# Patient Record
Sex: Female | Born: 1985 | Race: White | Hispanic: No | Marital: Married | Smoking: Never smoker
Health system: Southern US, Community
[De-identification: ages and names within clinical notes are randomized; demographics above are authoritative.]

## PROBLEM LIST (undated history)

## (undated) ENCOUNTER — Inpatient Hospital Stay (HOSPITAL_COMMUNITY): Payer: Self-pay

## (undated) DIAGNOSIS — R112 Nausea with vomiting, unspecified: Secondary | ICD-10-CM

## (undated) DIAGNOSIS — F988 Other specified behavioral and emotional disorders with onset usually occurring in childhood and adolescence: Secondary | ICD-10-CM

## (undated) DIAGNOSIS — Z9889 Other specified postprocedural states: Secondary | ICD-10-CM

## (undated) DIAGNOSIS — Z8742 Personal history of other diseases of the female genital tract: Secondary | ICD-10-CM

## (undated) DIAGNOSIS — N946 Dysmenorrhea, unspecified: Secondary | ICD-10-CM

## (undated) HISTORY — PX: GASTROCNEMIUS RECESSION: SUR1071

## (undated) HISTORY — DX: Personal history of other diseases of the female genital tract: Z87.42

## (undated) HISTORY — DX: Dysmenorrhea, unspecified: N94.6

## (undated) HISTORY — PX: WISDOM TOOTH EXTRACTION: SHX21

## (undated) HISTORY — DX: Other specified behavioral and emotional disorders with onset usually occurring in childhood and adolescence: F98.8

---
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
## 2006-07-08 ENCOUNTER — Emergency Department (HOSPITAL_COMMUNITY): Admission: EM | Admit: 2006-07-08 | Discharge: 2006-07-08 | Payer: Self-pay | Admitting: Emergency Medicine

## 2006-07-30 ENCOUNTER — Ambulatory Visit: Payer: Self-pay | Admitting: Family Medicine

## 2006-08-06 ENCOUNTER — Ambulatory Visit: Payer: Self-pay | Admitting: Family Medicine

## 2006-10-21 ENCOUNTER — Ambulatory Visit: Payer: Self-pay | Admitting: Family Medicine

## 2007-04-22 ENCOUNTER — Emergency Department (HOSPITAL_COMMUNITY): Admission: EM | Admit: 2007-04-22 | Discharge: 2007-04-22 | Payer: Self-pay | Admitting: Emergency Medicine

## 2007-08-25 ENCOUNTER — Telehealth (INDEPENDENT_AMBULATORY_CARE_PROVIDER_SITE_OTHER): Payer: Self-pay | Admitting: *Deleted

## 2007-09-07 ENCOUNTER — Ambulatory Visit: Payer: Self-pay | Admitting: Family Medicine

## 2007-10-22 ENCOUNTER — Telehealth (INDEPENDENT_AMBULATORY_CARE_PROVIDER_SITE_OTHER): Payer: Self-pay | Admitting: *Deleted

## 2009-07-03 ENCOUNTER — Encounter (INDEPENDENT_AMBULATORY_CARE_PROVIDER_SITE_OTHER): Payer: Self-pay | Admitting: *Deleted

## 2009-07-03 ENCOUNTER — Ambulatory Visit: Payer: Self-pay | Admitting: Family Medicine

## 2009-07-03 DIAGNOSIS — F988 Other specified behavioral and emotional disorders with onset usually occurring in childhood and adolescence: Secondary | ICD-10-CM | POA: Insufficient documentation

## 2009-07-03 DIAGNOSIS — N946 Dysmenorrhea, unspecified: Secondary | ICD-10-CM | POA: Insufficient documentation

## 2009-07-03 DIAGNOSIS — F411 Generalized anxiety disorder: Secondary | ICD-10-CM | POA: Insufficient documentation

## 2009-08-11 ENCOUNTER — Other Ambulatory Visit: Admission: RE | Admit: 2009-08-11 | Discharge: 2009-08-11 | Payer: Self-pay | Admitting: Family Medicine

## 2009-08-11 ENCOUNTER — Ambulatory Visit: Payer: Self-pay | Admitting: Family Medicine

## 2009-08-11 ENCOUNTER — Encounter: Payer: Self-pay | Admitting: Family Medicine

## 2009-08-15 ENCOUNTER — Encounter (INDEPENDENT_AMBULATORY_CARE_PROVIDER_SITE_OTHER): Payer: Self-pay | Admitting: *Deleted

## 2009-08-15 ENCOUNTER — Telehealth (INDEPENDENT_AMBULATORY_CARE_PROVIDER_SITE_OTHER): Payer: Self-pay | Admitting: *Deleted

## 2009-08-15 LAB — CONVERTED CEMR LAB
ALT: 16 units/L (ref 0–35)
AST: 19 units/L (ref 0–37)
Albumin: 4.2 g/dL (ref 3.5–5.2)
Chloride: 111 meq/L (ref 96–112)
Eosinophils Relative: 3.6 % (ref 0.0–5.0)
GFR calc non Af Amer: 111.13 mL/min (ref >60)
Glucose, Bld: 86 mg/dL (ref 70–99)
HCT: 37.3 % (ref 36.0–46.0)
Hemoglobin: 12.9 g/dL (ref 12.0–15.0)
LDL Cholesterol: 79 mg/dL (ref 0–99)
Lymphs Abs: 1.5 10*3/uL (ref 0.7–4.0)
Monocytes Relative: 11.7 % (ref 3.0–12.0)
Neutro Abs: 2.2 10*3/uL (ref 1.4–7.7)
Potassium: 3.7 meq/L (ref 3.5–5.1)
Sodium: 140 meq/L (ref 135–145)
TSH: 0.61 microintl units/mL (ref 0.35–5.50)
Total Protein: 6.7 g/dL (ref 6.0–8.3)
VLDL: 10.2 mg/dL (ref 0.0–40.0)
WBC: 4.4 10*3/uL — ABNORMAL LOW (ref 4.5–10.5)

## 2009-09-08 ENCOUNTER — Ambulatory Visit: Payer: Self-pay | Admitting: Family Medicine

## 2009-09-08 DIAGNOSIS — M549 Dorsalgia, unspecified: Secondary | ICD-10-CM | POA: Insufficient documentation

## 2010-02-05 ENCOUNTER — Ambulatory Visit: Payer: Self-pay | Admitting: Family

## 2010-02-05 DIAGNOSIS — J309 Allergic rhinitis, unspecified: Secondary | ICD-10-CM | POA: Insufficient documentation

## 2010-02-05 DIAGNOSIS — H1045 Other chronic allergic conjunctivitis: Secondary | ICD-10-CM | POA: Insufficient documentation

## 2010-02-05 DIAGNOSIS — J0100 Acute maxillary sinusitis, unspecified: Secondary | ICD-10-CM

## 2010-04-02 ENCOUNTER — Ambulatory Visit: Payer: Self-pay | Admitting: Family Medicine

## 2010-05-10 ENCOUNTER — Ambulatory Visit: Payer: Self-pay | Admitting: Diagnostic Radiology

## 2010-05-10 ENCOUNTER — Emergency Department (HOSPITAL_BASED_OUTPATIENT_CLINIC_OR_DEPARTMENT_OTHER): Admission: EM | Admit: 2010-05-10 | Discharge: 2010-05-10 | Payer: Self-pay | Admitting: Emergency Medicine

## 2010-05-10 ENCOUNTER — Ambulatory Visit: Payer: Self-pay | Admitting: Family Medicine

## 2010-05-10 DIAGNOSIS — R109 Unspecified abdominal pain: Secondary | ICD-10-CM | POA: Insufficient documentation

## 2010-05-24 ENCOUNTER — Telehealth (INDEPENDENT_AMBULATORY_CARE_PROVIDER_SITE_OTHER): Payer: Self-pay | Admitting: *Deleted

## 2010-07-19 ENCOUNTER — Telehealth (INDEPENDENT_AMBULATORY_CARE_PROVIDER_SITE_OTHER): Payer: Self-pay | Admitting: *Deleted

## 2010-08-02 ENCOUNTER — Encounter (INDEPENDENT_AMBULATORY_CARE_PROVIDER_SITE_OTHER): Payer: Self-pay | Admitting: *Deleted

## 2010-08-28 ENCOUNTER — Telehealth (INDEPENDENT_AMBULATORY_CARE_PROVIDER_SITE_OTHER): Payer: Self-pay | Admitting: *Deleted

## 2010-10-04 ENCOUNTER — Ambulatory Visit: Payer: Self-pay | Admitting: Family Medicine

## 2010-11-07 ENCOUNTER — Encounter (INDEPENDENT_AMBULATORY_CARE_PROVIDER_SITE_OTHER): Payer: Self-pay | Admitting: *Deleted

## 2010-12-11 ENCOUNTER — Telehealth (INDEPENDENT_AMBULATORY_CARE_PROVIDER_SITE_OTHER): Payer: Self-pay | Admitting: *Deleted

## 2011-01-01 NOTE — Progress Notes (Signed)
Summary: bcp questions  Phone Note Call from Patient   Caller: Patient Summary of Call: patient left msg on voicemail will be getting married in 2 months and wants to know that if she can skip her period w/ the micronor.  Follow-up for Phone Call        left message on machine ........Marland KitchenDoristine Devoid  May 24, 2010 9:10 AM   spoke w/ patient informed that after speaking w/ Dr. Beverely Low her current bcp wouldn't allow her to skip her period will need something estrogen based says she would like to try switching is worried she is going to get her period informed patient that switching this late in her cycle may still not guarantee that she isn't going to get her period but would speak w/ Dr. Beverely Low for recommendations lmp-05/17/10 yesterday was last day of period.......Marland KitchenDoristine Devoid  May 24, 2010 3:00 PM   Additional Follow-up for Phone Call Additional follow up Details #1::        can start Alesse on Sunday (low estrogen pil).  will decrease risk of nausea but may have breakthrough bleeding due to low estrogen content. Additional Follow-up by: Katherine Tabori MD,  May 24, 2010 3:14 PM    Additional Follow-up for Phone Call Additional follow up Details #2::    spoke w/ patient informed prescription sent to pharmacy once again infromed no guarantee she will be missing period ......Chemira Jones  May 24, 2010 5:12 PM   New/Updated Medications: * ALESSE 1 tab daily as directed.  disp 3 month supply Prescriptions: ALESSE 1 tab daily as directed.  disp 3 month supply  #3 month x 0   Entered by:   Chemira Jones   Authorized by:   Katherine Tabori MD   Signed by:   Chemira Jones on 05/24/2010   Method used:   Printed then faxed to ...       [facility] #[id number]* (retail)       [address]       [place], [place]  [address]       Ph: [phone]       Fax: [phone]   RxID:   [id number]

## 2011-01-01 NOTE — Assessment & Plan Note (Signed)
Summary: SINUS & EAR PROBLEM X 10 DAYS/KN   Vital Signs:  Patient profile:   25 year old female Height:      62.50 inches Weight:      120 pounds BMI:     21.68 Temp:     98.5 degrees F oral Pulse rate:   84 / minute BP sitting:   112 / 64  (right arm) CC: sinus congestion and cough along w/ ear pain    History of Present Illness: 25 yo woman here today w/ ? sinus infxn.  sxs started 10 days ago w/ 'huge sinus HA'.  has had blurry vision (while sitting in front of the computer), having nasal congestion and drainage, R ear pain>L.  no cough.  no fever.  no tooth pain.  no abd pain.  some dizziness and nausea but no vomiting or diarrhea.  Current Medications (verified): 1)  Wal-Zyr 10 Mg Tabs (Cetirizine Hcl) .... Take 1 Tablet By Mouth Once A Day 2)  Dramamine Less Drowsy 25 Mg Tabs (Meclizine Hcl) .... As Needed 3)  Alesse .Marland Kitchen.. 1 Tab Daily As Directed.  Disp 3 Month Supply  Allergies (verified): 1)  ! Vicodin 2)  ! Erythromycin 3)  ! * Msg  Review of Systems      See HPI  Physical Exam  General:  Well-developed,well-nourished,in no acute distress; alert,appropriate and cooperative throughout examination Head:  NCAT, no TTP over frontal or maxillary sinuses Eyes:  no injxn or inflammation Ears:  mild TM retraction bilaterally, otherwise WNL Nose:  + congestion, turbinate edema Mouth:  Oral mucosa and oropharynx without lesions or exudates.  Teeth in good repair. Neck:  No deformities, masses, or tenderness noted. Lungs:  CTAB Heart:  Normal rate and regular rhythm. S1 and S2 normal without gallop, murmur, click, rub or other extra sounds.   Impression & Recommendations:  Problem # 1:  ALLERGIC RHINITIS (ICD-477.9) Assessment Unchanged likely contributing to pt's sinus congestion and eustachian tube dysfxn.  add sudafed and Nasonex (sample given).  reviewed supportive care and red flags that should prompt return.  Pt expresses understanding and is in agreement w/ this  plan. The following medications were removed from the medication list:    Promethazine Hcl 25 Mg Tabs (Promethazine hcl) .Marland Kitchen... 1 tab by mouth q6 as needed for nausea Her updated medication list for this problem includes:    Wal-zyr 10 Mg Tabs (Cetirizine hcl) .Marland Kitchen... Take 1 tablet by mouth once a day  Complete Medication List: 1)  Wal-zyr 10 Mg Tabs (Cetirizine hcl) .... Take 1 tablet by mouth once a day 2)  Dramamine Less Drowsy 25 Mg Tabs (Meclizine hcl) .... As needed 3)  Alesse  .Marland Kitchen.. 1 tab daily as directed.  disp 3 month supply  Patient Instructions: 1)  Your nasal congestion is causing your ear pain and sinus pressure 2)  Take Sudafed as directed on the box for the next 3-5 days 3)  Use the Nasonex- 2 sprays each nostril daily- at least until you're feeling better 4)  Tylenol or ibuprofen as needed for pain or fever 5)  Drink plenty of fluids 6)  Mucinex to help thin your congestion 7)  Hang in there!   Orders Added: 1)  Est. Patient Level III [[address]]

## 2011-01-01 NOTE — Progress Notes (Signed)
Summary: NEEDS LUTERA (ALLESSE) 90 DAY SUPPLY TO [facility]  Phone Note Call from Patient Call back at Home Phone [phone]   Caller: Patient Summary of Call: PATIENT NOW HAS NEW NAME AND ADDRESS-BOTH HAVE BEEN UPDATED INN IDX AND EMR  -WAS TOLD BY INSURANCE THAT SHE CANT PICKUP LUTERA (ALESSE)  AT [facility] ANYMORE--NOW NEEDS TO ORDER 90 DAY PRESCRIPTION THRU [facility]---PLEASE CALL IN NEW PRESCRIPTION TO [facility] Initial call taken by: Jerolyn Shin,  August 28, 2010 3:45 PM    Prescriptions: ALESSE 1 tab daily as directed.  disp 3 month supply  #3 month x 0   Entered by:   Jeremy Johann CMA   Authorized by:   Neena Rhymes MD   Signed by:   Jeremy Johann CMA on 08/28/2010   Method used:   Faxed to ...       [facility])       [address] [address]       [place], [place]  [address]       Ph: [phone]       Fax: [phone]   RxID:   [id number]

## 2011-01-01 NOTE — Assessment & Plan Note (Signed)
Summary: reaction to birth control pill//lch   Vital Signs:  Patient profile:   25 year old female Weight:      114 pounds Temp:     98.4 degrees F oral BP sitting:   110 / 80  (left arm)  Vitals Entered By: Doristine Devoid (Apr 02, 2010 11:35 AM) CC: nausea, vomiting and some stomach cramping after taking bcp last night   History of Present Illness: 25 yo woman here today for N/V and abd cramping after taking OCP last night.  pt never started pills in August.  took pill at 8pm and woke at 3am vomiting 'yellow bile'.  then developed diarrhea.  took tums- vomited.  took immodium at 6am with improvement in sxs.  reports 'the worst cramps i've ever felt'- relieved w/ heating pad.  reports having cramps every 3-5 minutes initially but this has spaced out.  no known sick contacts.  did not take OCP w/ food.  LMP 4/20.  denies fever.  reports mom also had severe problems w/ N/V when on OCPs.  Medications Prior to Update: 1)  Sprintec 28 0.25-35 Mg-Mcg Tabs (Norgestimate-Eth Estradiol) .... Take 1 Tab Daily As Directed For Painful Periods 2)  Wal-Zyr 10 Mg Tabs (Cetirizine Hcl) .... Take 1 Tablet By Mouth Once A Day 3)  Dramamine Less Drowsy 25 Mg Tabs (Meclizine Hcl) .... As Needed 4)  Patanol 0.1 % Soln (Olopatadine Hcl) .... One Drop Each Eye Two Times A Day  Allergies (verified): 1)  ! Vicodin 2)  ! Erythromycin 3)  ! * Msg  Past History:  Social History: Last updated: 07/03/2009 in school for medical office administration lives w/ mom and dad is a CNA- works in a surgical center ** has phobia of needles**  Review of Systems      See HPI  Physical Exam  General:  pt not feeling well Mouth:  MMM Lungs:  Normal respiratory effort, chest expands symmetrically. Lungs are clear to auscultation, no crackles or wheezes. Heart:  Normal rate and regular rhythm. S1 and S2 normal without gallop, murmur, click, rub or other extra sounds. Abdomen:  soft, NT/ND, +BS- normoactive Psych:   very anxious   Impression & Recommendations:  Problem # 1:  VOMITING (ICD-787.03) Assessment New difficult to determine whether this is due to viral illness or start of OCPs.  pt to stop OCPs.  reviewed appropriate start date- pt started this pack mid cycle during peak estrogen levels.  pt reports feeling '90% better' at time of visit.  no signs of dehydration.  script for phenergan given to use as needed.  will switch pt's OCP to Micronor given reported familial intolerance to estrogen.  stressed the importance of taking w/ food.  will follow.  Complete Medication List: 1)  Wal-zyr 10 Mg Tabs (Cetirizine hcl) .... Take 1 tablet by mouth once a day 2)  Dramamine Less Drowsy 25 Mg Tabs (Meclizine hcl) .... As needed 3)  Patanol 0.1 % Soln (Olopatadine hcl) .... One drop each eye two times a day 4)  Promethazine Hcl 25 Mg Tabs (Promethazine hcl) .Marland Kitchen.. 1 tab by mouth q6 as needed for nausea 5)  Ortho Micronor 0.35 Mg Tabs (Norethindrone) .Marland Kitchen.. 1 tab daily as directed.  disp 1 pack  Patient Instructions: 1)  Follow up as needed 2)  STOP the pill pack 3)  Plan on restarting the Sunday of your next period.  If you bleed on a Sunday, start that day.  If you bleed Mon-Sat, start  your pill that Sunday. 4)  Always take your birth control with food to minimize nausea 5)  Use the phenergan and immodium as needed 6)  Drink plenty of fluids 7)  Hang in there!! Prescriptions: ORTHO MICRONOR 0.35 MG TABS (NORETHINDRONE) 1 tab daily as directed.  disp 1 pack  #1 x 11   Entered and Authorized by:   Katherine Tabori MD   Signed by:   Katherine Tabori MD on 04/02/2010   Method used:   Electronically to        [facility] #[id number]* (retail)       [address]       [place]       [place], [place]  [address]       Ph: [phone]       Fax: [phone]   RxID:   [id number] PROMETHAZINE HCL 25 MG  TABS (PROMETHAZINE HCL) 1 tab by mouth Q6 as needed for nausea  #20 x 0    Entered and Authorized by:   Katherine Tabori MD   Signed by:   Katherine Tabori MD on 04/02/2010   Method used:   Electronically to        [facility] #[id number]* (retail)       [address]       [place], [place]  [address]       Ph: [phone]       Fax: [phone]   RxID:   [id number]

## 2011-01-01 NOTE — Assessment & Plan Note (Signed)
Summary: sinus pressure and pain//lch   Vital Signs:  Patient profile:   25 year old female Weight:      114.75 pounds BMI:     20.73 Temp:     98.2 degrees F oral Pulse rate:   68 / minute Pulse rhythm:   regular Resp:     16 per minute BP sitting:   104 / 64  (right arm) Cuff size:   regular  Vitals Entered By: Mervin Kung CMA (February 05, 2010 3:23 PM) CC: room 4  Sinus pressure and pain x 1 week.  Dizziness episode yesterday. Comments needs refill on sprintec   CC:  room 4  Sinus pressure and pain x 1 week.  Dizziness episode yesterday.Marland Kitchen  History of Present Illness: Ms Katie Lindsey is a 25 year old female who presents withc/o sinus pressure x 5 days.  She coughs up yellow mucous.  Has been taking walzyr.  Denies fever.  Yesterday had some dizziness.  Eyes have been itching, especially right  Allergies: 1)  ! Vicodin 2)  ! Erythromycin 3)  ! * Msg  Physical Exam  General:  Well-developed,well-nourished,in no acute distress; alert,appropriate and cooperative throughout examination Head:  Normocephalic and atraumatic without obvious abnormalities. No apparent alopecia or balding. Some mild tenderness to the frontal sinus to palpation Eyes:  PERRLA,  some mild injection R sclear Ears:  Serous effusion left ear without bulging or erythema Mouth:  Oral mucosa and oropharynx without lesions or exudates.  Teeth in good repair. Neck:  No deformities, masses, or tenderness noted. Lungs:  Normal respiratory effort, chest expands symmetrically. Lungs are clear to auscultation, no crackles or wheezes. Heart:  Normal rate and regular rhythm. S1 and S2 normal without gallop, murmur, click, rub or other extra sounds.   Impression & Recommendations:  Problem # 1:  SINUSITIS (ICD-473.9) Assessment New Dizziness is likely related to sinusitus as well as serous effusion in ear.  Will plan to treat with amoxicilin x 10 days. Her updated medication list for this problem includes:  Amoxicillin 500 Mg Cap (Amoxicillin) .Marland Kitchen... Take 1 capsule by mouth three times a day x 10 days  Problem # 2:  CONJUNCTIVITIS, ALLERGIC (ICD-372.14) Assessment: New Will treat with patanol, pt instructed to call if redness worsens or does not improve.   Complete Medication List: 1)  Sprintec 28 0.25-35 Mg-mcg Tabs (Norgestimate-eth estradiol) .... Take 1 tab daily as directed for painful periods 2)  Wal-zyr 10 Mg Tabs (Cetirizine hcl) .... Take 1 tablet by mouth once a day 3)  Dramamine Less Drowsy 25 Mg Tabs (Meclizine hcl) .... As needed 4)  Amoxicillin 500 Mg Cap (Amoxicillin) .... Take 1 capsule by mouth three times a day x 10 days 5)  Patanol 0.1 % Soln (Olopatadine hcl) .... One drop each eye two times a day  Patient Instructions: 1)  Call if you develop fever over 101, increasing sinus pressure, pain with eye movement, increased facial tenderness of swelling, or if you develop visual changes or if the redness dose not improve with drops Prescriptions: PATANOL 0.1 % SOLN (OLOPATADINE HCL) one drop each eye two times a day  #1 x 0   Entered and Authorized by:   Lemont Fillers FNP   Signed by:   Lemont Fillers FNP on 02/05/2010   Method used:   Electronically to        [facility] #[id number]* (retail)       [address]  [place], [place]  [address]       Ph: [phone]       Fax: [phone]   RxID:   [id number] AMOXICILLIN 500 MG CAP (AMOXICILLIN) Take 1 capsule by mouth three times a day X 10 days  #30 x 0   Entered and Authorized by:   Lemont Fillers FNP   Signed by:   Lemont Fillers FNP on 02/05/2010   Method used:   Electronically to        [facility] #[id number]* (retail)       [address]       [place], [place]  [address]       Ph: [phone]       Fax: [phone]   RxID:   [id number]   Current Allergies (reviewed today): ! VICODIN !  ERYTHROMYCIN ! * MSG

## 2011-01-01 NOTE — Assessment & Plan Note (Signed)
Summary: possible ovarian cyst/walk-in//kn   Vital Signs:  Patient profile:   25 year old female Weight:      112 pounds Temp:     97.7 degrees F oral BP sitting:   112 / 72  (left arm)  Vitals Entered By: Doristine Devoid (May 10, 2010 8:51 AM) CC: Pain started on L side x3-4days ago now this morning pain on R side cramping feeling and sharp pain    History of Present Illness: 25 yo woman here today for abd pain.  sxs started 3-4 days ago on L side, described as sharp, worse w/ movement.  this AM developed severe abd pain, doubled over, 'couldn't move my legs b/c it hurt', pain is radiating to R side and rectum.  now pain on R side.  no nausea/vomiting.  pt feels pain is an ovarian cyst- mom has hx of similar and family feels sxs are consistent.  hasn't taken anything for pain.  mom reports pt was very sensitive to bumps in the car on the ride over.  Problems Prior to Update: 1)  Conjunctivitis, Allergic  (ICD-372.14) 2)  Allergic Rhinitis  (ICD-477.9) 3)  Sinusitis  (ICD-473.9) 4)  Back Pain  (ICD-724.5) 5)  Routine Gynecological Examination  (ICD-V72.31) 6)  Screening For Malignant Neoplasm of The Cervix  (ICD-V76.2) 7)  Anxiety  (ICD-300.00) 8)  Dysmenorrhea  (ICD-625.3) 9)  Add  (ICD-314.00)  Current Medications (verified): 1)  Wal-Zyr 10 Mg Tabs (Cetirizine Hcl) .... Take 1 Tablet By Mouth Once A Day 2)  Dramamine Less Drowsy 25 Mg Tabs (Meclizine Hcl) .... As Needed 3)  Patanol 0.1 % Soln (Olopatadine Hcl) .... One Drop Each Eye Two Times A Day 4)  Promethazine Hcl 25 Mg  Tabs (Promethazine Hcl) .Marland Kitchen.. 1 Tab By Mouth Q6 As Needed For Nausea 5)  Ortho Micronor 0.35 Mg Tabs (Norethindrone) .Marland Kitchen.. 1 Tab Daily As Directed.  Disp 1 Pack  Allergies (verified): 1)  ! Vicodin 2)  ! Erythromycin 3)  ! * Msg  Past History:  Past Medical History: Last updated: 07/03/2009 ADD dysmenorrhia  Social History: Last updated: 07/03/2009 in school for medical office  administration lives w/ mom and dad is a CNA- works in a surgical center ** has phobia of needles**  Review of Systems      See HPI  Physical Exam  General:  obviously uncomfortable, walking bent at a 90 degree angle Lungs:  CTAB Heart:  tachy but regular Abdomen:  soft, point TTP over McBurney's point, pain on R side w/ palpation of the L, voluntary guarding.  + BS Psych:  tearful, very anxiou   Impression & Recommendations:  Problem # 1:  ABDOMINAL PAIN, ACUTE (ICD-789.00) Assessment New given pt's acutely different pain this AM in RLQ need to send to ER for CT and evaluation for possible appendicitis.  possible ovarian cyst but given abd pain pelvic US likely insufficient.  level of pain severe enough to warrant immediate evaluation and not wait for outpt CT.  discussed plan w/ pt and mother.  they express understanding and are in agreement.  spoke w/ triage nurse at [facility]- they are aware of pt.  Complete Medication List: 1)  Wal-zyr 10 Mg Tabs (Cetirizine hcl) .... Take 1 tablet by mouth once a day 2)  Dramamine Less Drowsy 25 Mg Tabs (Meclizine hcl) .... As needed 3)  Patanol 0.1 % Soln (Olopatadine hcl) .... One drop each eye two times a day 4)  Promethazine Hcl 25 Mg  Tabs (Promethazine hcl) .Marland Kitchen.. 1 tab by mouth q6 as needed for nausea 5)  Ortho Micronor 0.35 Mg Tabs (Norethindrone) .Marland Kitchen.. 1 tab daily as directed.  disp 1 pack

## 2011-01-01 NOTE — Progress Notes (Signed)
Summary: bcp refill   Phone Note Refill Request Message from:  Fax from Pharmacy on July 19, 2010 2:12 PM  Refills Requested: Medication #1:  ALESSE 1 tab daily as directed.  disp 3 month supply. [facility] - [id number]  Initial call taken by: Okey Regal Spring,  July 19, 2010 2:14 PM    Prescriptions: ALESSE 1 tab daily as directed.  disp 3 month supply  #3 month x 0   Entered by:   Doristine Devoid CMA   Authorized by:   Neena Rhymes MD   Signed by:   Doristine Devoid CMA on 07/20/2010   Method used:   Printed then faxed to ...       [facility] #[id number]* (retail)       [address]       [place], [place]  [address]       Ph: [phone]       Fax: [phone]   RxID:   [id number]

## 2011-01-03 NOTE — Progress Notes (Signed)
Summary: Ins co needs results from June 2011 CAT scan  Phone Note Other Incoming   Caller: Cherie from [employer] Summary of Call: Cherie from [employer] called to see if we had the cat scan results that took place after the 05/10/10 office visit----see copy of Med Rec Request in our system dated 11/21/2010  Cherie has received all the other med records, but no copy of the Cat scan results---can we get a copy of this report?  Cherie says we can call her with the results of the cat scan--no need to fax it to her  She works Mon - Fri  8:00 to 4:15-----her direct number is [phone]  Initial call taken by: Jerolyn Shin,  December 11, 2010 2:34 PM  Follow-up for Phone Call        Spoke with cherie advise her that Pt was seen in ED and we did not receive copy of CT result. Check  E-chart to obtain record and faxed copy of CT report to cherie @ [phone].............Marland KitchenFelecia Deloach CMA  December 11, 2010 3:51 PM

## 2011-02-18 LAB — COMPREHENSIVE METABOLIC PANEL
ALT: 11 U/L (ref 0–35)
Alkaline Phosphatase: 57 U/L (ref 39–117)
BUN: 9 mg/dL (ref 6–23)
CO2: 21 mEq/L (ref 19–32)
GFR calc non Af Amer: >60 mL/min (ref >60)
Glucose, Bld: 91 mg/dL (ref 70–99)
Potassium: 4.2 mEq/L (ref 3.5–5.1)
Sodium: 141 mEq/L (ref 135–145)
Total Protein: 7.6 g/dL (ref 6.0–8.3)

## 2011-02-18 LAB — URINALYSIS, ROUTINE W REFLEX MICROSCOPIC
Glucose, UA: NEGATIVE mg/dL
Ketones, ur: 15 mg/dL — AB
Nitrite: NEGATIVE
Protein, ur: NEGATIVE mg/dL
Urobilinogen, UA: 0.2 mg/dL (ref 0.0–1.0)

## 2011-02-18 LAB — DIFFERENTIAL
Basophils Absolute: 0 10*3/uL (ref 0.0–0.1)
Basophils Relative: 0 % (ref 0–1)
Eosinophils Absolute: 0 10*3/uL (ref 0.0–0.7)
Monocytes Relative: 8 % (ref 3–12)
Neutro Abs: 7 10*3/uL (ref 1.7–7.7)
Neutrophils Relative %: 79 % — ABNORMAL HIGH (ref 43–77)

## 2011-02-18 LAB — CBC
MCHC: 34.4 g/dL (ref 30.0–36.0)
RDW: 11 % — ABNORMAL LOW (ref 11.5–15.5)

## 2011-02-18 LAB — PREGNANCY, URINE: Preg Test, Ur: NEGATIVE

## 2011-04-17 ENCOUNTER — Encounter: Payer: Self-pay | Admitting: Family Medicine

## 2011-05-25 IMAGING — CT CT ABD-PELV W/ CM
2 of 3 series · 16 of 46 positions shown, 18 images · IV contrast (APPLIED)
Comparison: None

CLINICAL DATA: Right lower quadrant pain.

CT ABDOMEN AND PELVIS WITH CONTRAST
TECHNIQUE: Multidetector CT imaging of the abdomen and pelvis was
performed following the standard protocol during bolus
administration of intravenous contrast.
Contrast: 100 ml Omnipaque 300 IV.

[Series 2: abd/pelvis 5.0 b31f · axial · 0.62mm/px · z∈[-712,-378]mm · 13 of 79 slices shown, 15 images]
[im 6/79  soft-tissue]
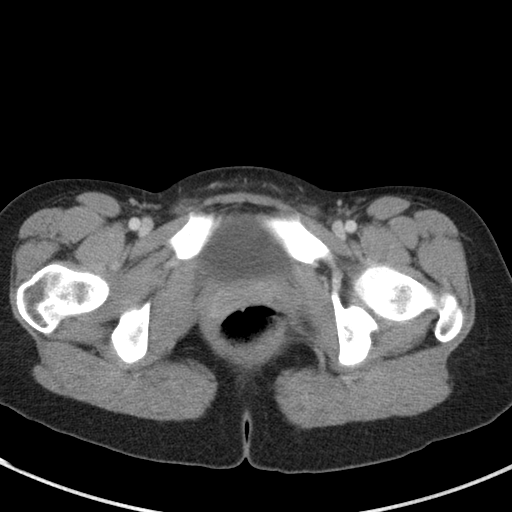
[im 6/79  bone]
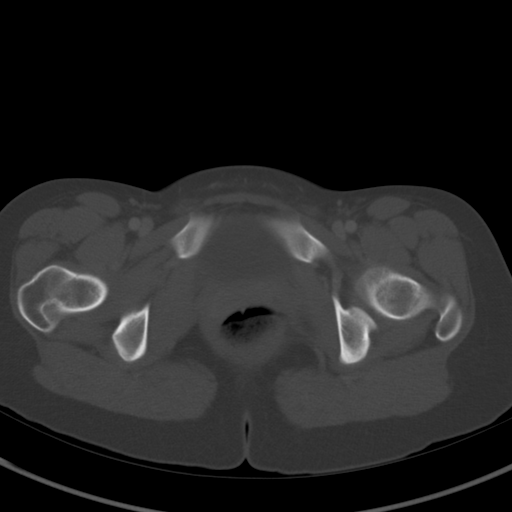
[im 11/79  soft-tissue]
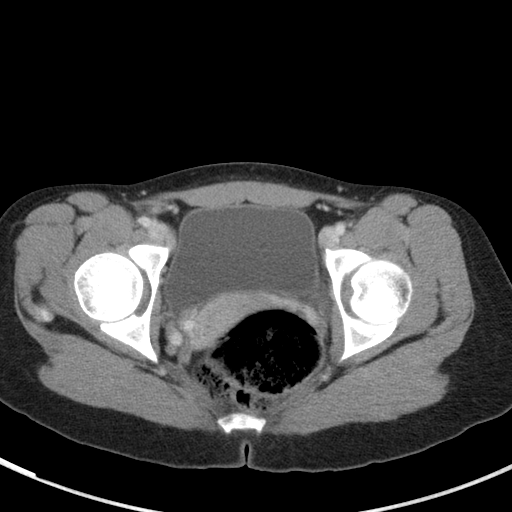
[im 16/79  soft-tissue]
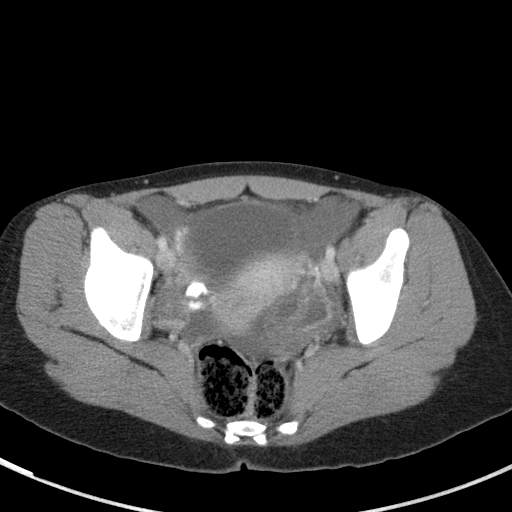
[im 23/79  soft-tissue]
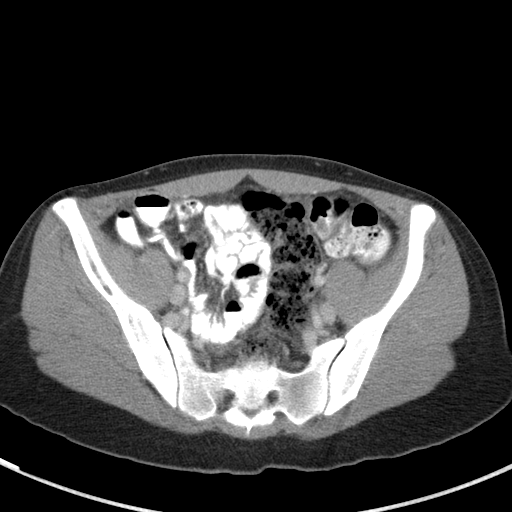
[im 28/79  soft-tissue]
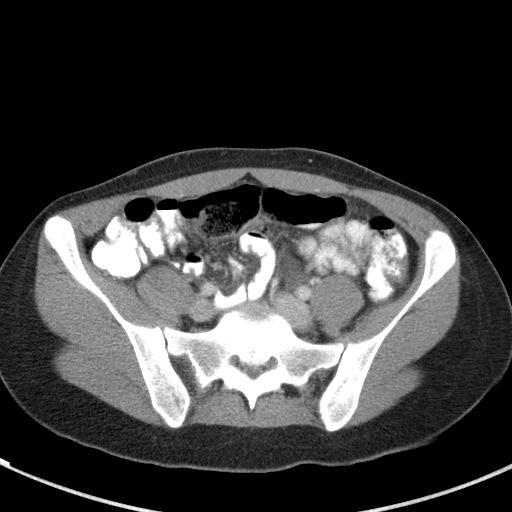
[im 33/79  soft-tissue]
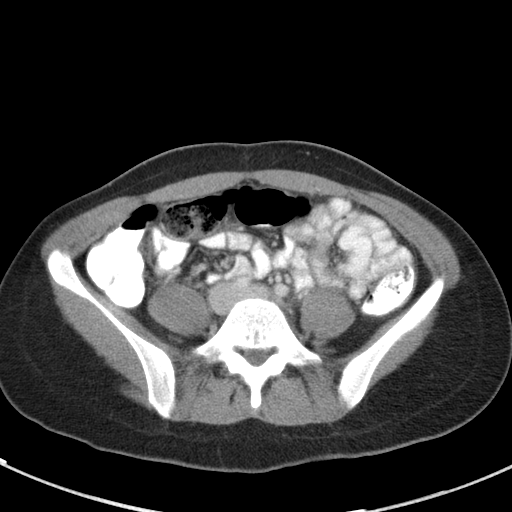
[im 41/79  soft-tissue]
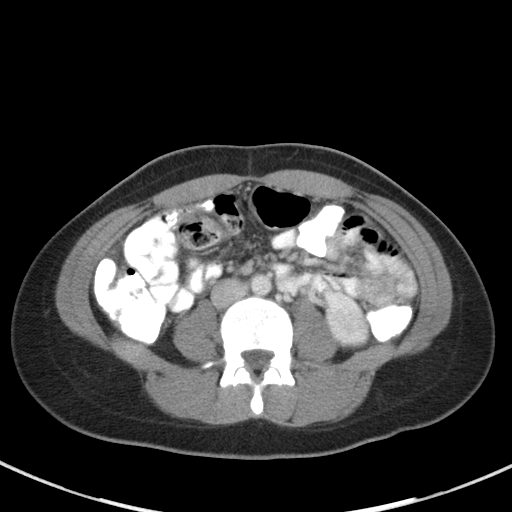
[im 46/79  soft-tissue]
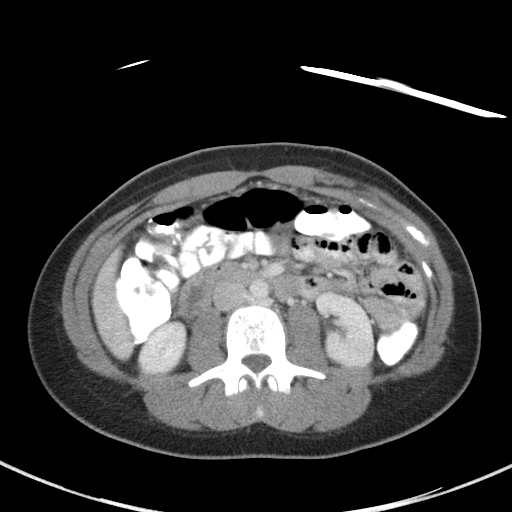
[im 51/79  soft-tissue]
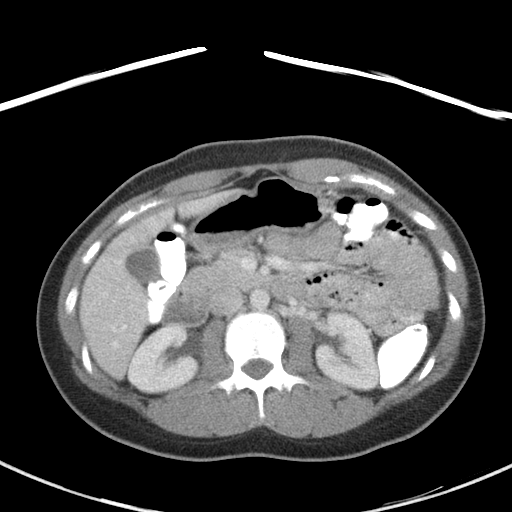
[im 51/79  bone]
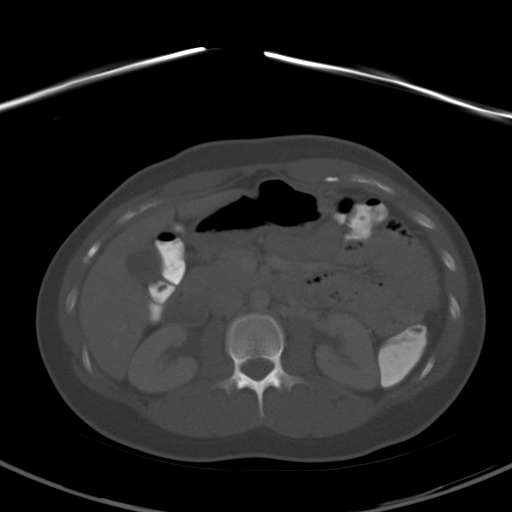
[im 56/79  soft-tissue]
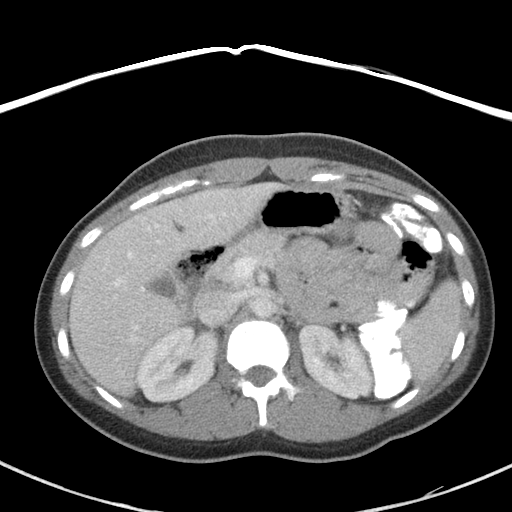
[im 63/79  soft-tissue]
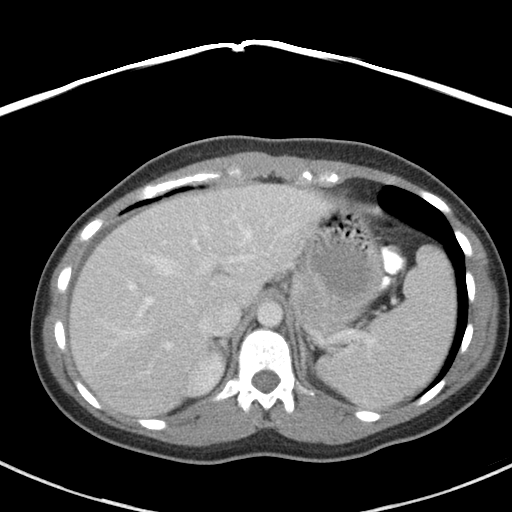
[im 68/79  soft-tissue]
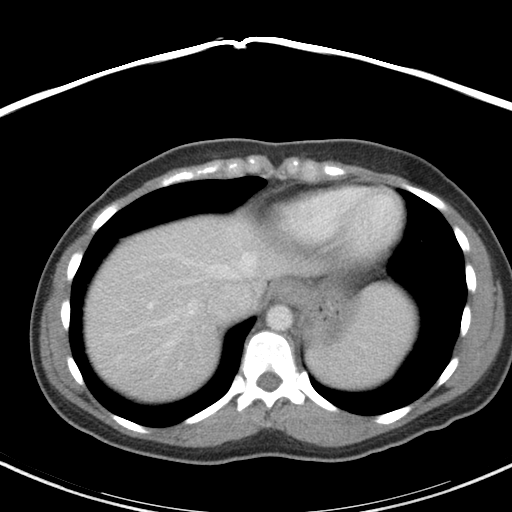
[im 73/79  soft-tissue]
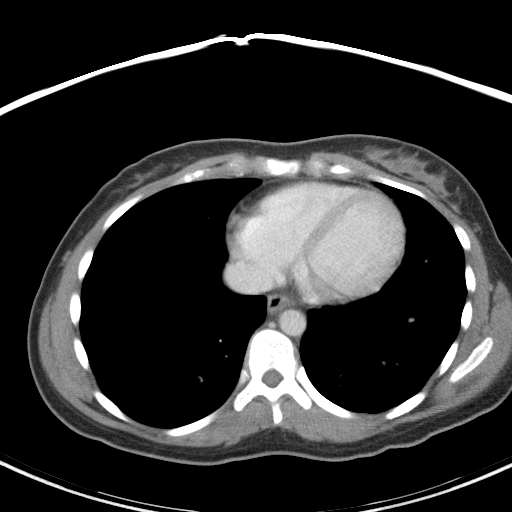

[Series 5: abd/pelvis 3.0 coronal · coronal · 0.60mm/px · 3 of 71 slices shown]
[im 24/71  soft-tissue]
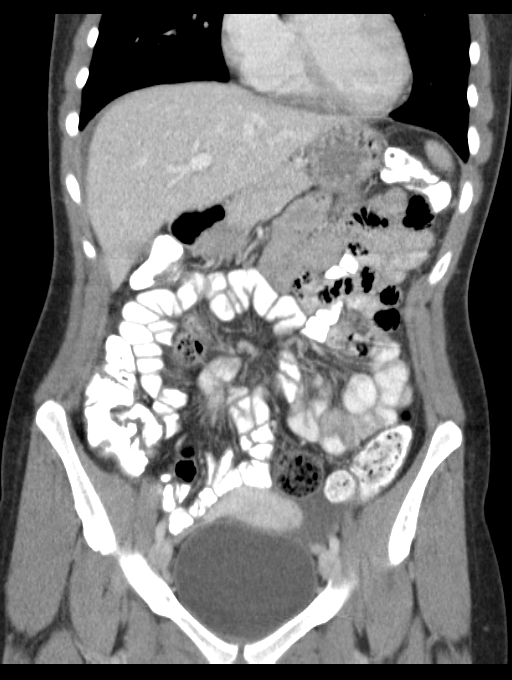
[im 32/71  soft-tissue]
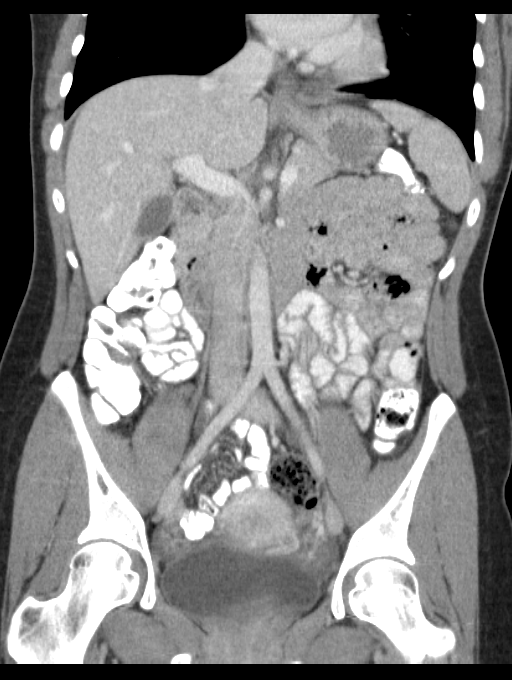
[im 39/71  soft-tissue]
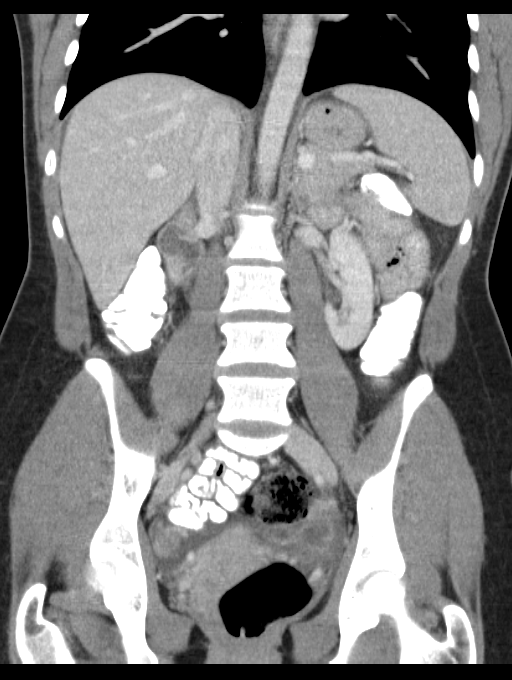

[16 of 46 positions shown; findings below may reference images not displayed]

FINDINGS: Lung bases are clear.  No effusions.  Heart is normal
size.

There is a small amount of free fluid adjacent to the liver, with
moderate free fluid in the pelvis.  Uterus is unremarkable.
Irregular enhancing cystic lesion is noted in the left adnexa.  It
is unclear this represents a ruptured ovarian cyst or could
represent pelvic inflammatory disease.  Recommend clinical
correlation.  No definite right adnexal abnormality.

Appendix is visualized and is normal.  Large amount stool
throughout the rectosigmoid colon.  Small bowel is unremarkable.

Liver, spleen, gallbladder, pancreas, adrenals and kidneys are
unremarkable.  Aorta is normal caliber.

No acute bony abnormality.
IMPRESSION: Free fluid in the abdomen and pelvis, most pronounced in the
pelvis.  Irregular cystic area in the left adnexa with enhancing
walls could reflect a ruptured cyst or pelvic inflammatory disease.
Recommend clinical correlation.

Moderate stool burden in the rectosigmoid colon.

Normal appendix.

## 2011-07-29 ENCOUNTER — Telehealth: Payer: Self-pay | Admitting: Family Medicine

## 2011-08-07 ENCOUNTER — Encounter: Payer: Self-pay | Admitting: Family Medicine

## 2011-08-07 ENCOUNTER — Ambulatory Visit (INDEPENDENT_AMBULATORY_CARE_PROVIDER_SITE_OTHER): Payer: 59 | Admitting: Family Medicine

## 2011-08-07 VITALS — BP 110/80 | Temp 99.3°F | Wt 123.0 lb

## 2011-08-07 DIAGNOSIS — D229 Melanocytic nevi, unspecified: Secondary | ICD-10-CM

## 2011-08-07 DIAGNOSIS — D239 Other benign neoplasm of skin, unspecified: Secondary | ICD-10-CM

## 2011-08-07 NOTE — Patient Instructions (Signed)
We will call you with your derm referral Right now, there is nothing that has me overly concerned Call with any questions or concerns Hang in there!!!!

## 2011-08-07 NOTE — Progress Notes (Signed)
  Subjective:    Patient ID: Katie Lindsey, female    DOB: 11/21/1986, 25 y.o.   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  Fears this is skin cancer.  Has severe phobia of needles and is very anxious about mole removal.  Review of Systems For ROS see HPI     Objective:   Physical Exam  Vitals reviewed. Constitutional: She appears well-developed and well-nourished.  Skin: Skin is warm and dry.       1.5 cm oblong nevus central back w/out concerning borders or pigmentation. Has multiple other nevi- small but dark, irregularly pigmented mole just inferior to one pt is concerned about is more concerning.          Assessment & Plan:

## 2011-08-07 NOTE — Assessment & Plan Note (Signed)
Due to multiple nevi and pt's severe phobia of needles and desire for optimal cosmetic outcome, will refer to derm.  Pt expressed understanding and is in agreement w/ plan.

## 2011-08-08 NOTE — Telephone Encounter (Signed)
error 

## 2012-08-17 ENCOUNTER — Telehealth: Payer: Self-pay | Admitting: Family Medicine

## 2012-08-17 NOTE — Telephone Encounter (Signed)
needs to know if we have records showing pt got Chicken pox vaccine from peds dr in our records, if so she needs documentation Cb# [phone] Put chart on chair for review

## 2012-08-18 NOTE — Telephone Encounter (Signed)
Noted in pt chart that the only immunization noted is the TD, no chicken pox vaccine noted, pt noted that her mom said she had it but the titers cam back that she is NOT immune, the pediatric office is no longer open and unable to contact,advised she can try to contact her school for any immunization records they may have on file, pt understood and will try to contact school

## 2012-08-25 ENCOUNTER — Ambulatory Visit (INDEPENDENT_AMBULATORY_CARE_PROVIDER_SITE_OTHER): Payer: 59 | Admitting: *Deleted

## 2012-08-25 ENCOUNTER — Telehealth: Payer: Self-pay | Admitting: *Deleted

## 2012-08-25 DIAGNOSIS — Z23 Encounter for immunization: Secondary | ICD-10-CM

## 2012-08-25 DIAGNOSIS — Z299 Encounter for prophylactic measures, unspecified: Secondary | ICD-10-CM

## 2012-08-25 NOTE — Telephone Encounter (Signed)
Pt came into office for varicella vaccine per titers noted and vaccine scheduled, after vaccine given pt asked when she needed to come get the second step per was advised by her GYN that the varicella shot needs to be given in steps now, pt notes she is receiving the vaccine per trying to get pregnant, advised for pt to contact GYN for verification but also wanted to see if MD Beverely Low has ever heard about a second step vaccine for chicken pox/varicella, please advise

## 2012-08-25 NOTE — Patient Instructions (Signed)
Pt given instructions/format for vaccine given. Pt understood

## 2012-08-26 NOTE — Telephone Encounter (Signed)
.  left message to have patient return my call.  

## 2012-08-26 NOTE — Telephone Encounter (Signed)
2nd dose is required- needs to be given at least 4 weeks after 1st

## 2012-08-26 NOTE — Telephone Encounter (Signed)
Spoke to pt to advise results/instructions. Pt understood. Pt will schedule second dosage for vaccine at earliest convience

## 2012-09-22 ENCOUNTER — Ambulatory Visit (INDEPENDENT_AMBULATORY_CARE_PROVIDER_SITE_OTHER): Payer: 59

## 2012-09-22 DIAGNOSIS — Z23 Encounter for immunization: Secondary | ICD-10-CM

## 2012-12-02 NOTE — L&D Delivery Note (Signed)
Delivery Note At 2:30 PM a viable female was delivered via Vaginal, Spontaneous Delivery (Presentation: ; Occiput Anterior).  APGAR: 9, 9; weight pending Placenta status: Intact, Spontaneous.  Cord: 3 vessels with the following complications: .  Cord pH: not done  Anesthesia: Epidural  Episiotomy: None Lacerations: 2nd degree Suture Repair: 2.0 3.0 chromic Est. Blood Loss (mL): 300  Mom to postpartum.  Baby to nursery-stable.  Oakleigh Hesketh L 08/04/2013, 2:45 PM

## 2012-12-04 ENCOUNTER — Telehealth: Payer: Self-pay | Admitting: Family Medicine

## 2012-12-04 NOTE — Telephone Encounter (Signed)
Agree w/ advice given 

## 2012-12-04 NOTE — Telephone Encounter (Signed)
Pt called to get appt for this afternoon. Pt has dull abdominal pain and is [redacted] weeks pregnant. Patient was hoping to get an ultrasound. I advised pt we have no available appointments today and no ultrasound machine. Spoke w/Tabori who advised that pt should call gyn and ask for MD on call or go to [facility] MAU. I called pt back and informed pt. Patient understood.

## 2012-12-23 ENCOUNTER — Telehealth: Payer: Self-pay | Admitting: Family Medicine

## 2012-12-23 NOTE — Telephone Encounter (Signed)
Tamiflu is category C in pregnancy and unless recommended by OB will not be given.  Should take Zpack as prescribed by OB, drink plenty of fluids to avoid dehydration, tylenol as needed for fever, and rest.  No need for OV at this time.

## 2012-12-23 NOTE — Telephone Encounter (Signed)
[facility] Triage Call Report Triage Record Num: [id number] Operator: Remonia Richter Patient Name: Katie Lindsey Call Date & Time: 12/22/2012 9:34:57PM Patient Phone: [phone] PCP: Neena Rhymes ([facility]) Patient Gender: Female PCP Fax : Patient DOB: 1986-03-02 Practice Name: [facility] Reason for Call: Caller: Beverly/Mother; PCP: Sheliah Hatch.; CB#: [phone];Mom is calling regarding Cough/Congestion; seen in OB office today and given Z-Pac for sinus infection,now her husband was tested positive for flu, she has fever at 100.6 ax tonight along with cough, mom wants to know if she needs seen here for Tamiflu, she gave triage information based on seeing daughter earlier tonight, Call Provider disposition obtained due to being High risk for flu, she has already talked to [facility] who wants her to call us , mom advised she call in am for appointment for eval due to being High risk,no standing order for Tamiflu, Home Care advice given, mom to have her call in am for appt,callback perimeters gone over Protocol(s) Used: Flu-Like Symptoms Recommended Outcome per Protocol: Call Provider within 8 Hours Reason for Outcome: Any temperature elevation in an individual with a weakened immune system OR a frail elderly person Care Advice: ~ Rest until symptoms improve. Aspirin should not be used in anyone, 26 years of age and under, for temperature control, pain, or aches when flu is a possibility. ~ ~ SYMPTOM / CONDITION MANAGEMENT COMFORT MEASURES FOR A FEVER: - Drink cool liquids or eat ice chips or popsicles. Avoid drinks with alcohol or caffeine. - Wear one layer of light-weight clothing. - Consider using a fan to improve circulation. - Rest until temperature returns to normal and other symptoms improve. - Use a lightweight blanket or other bedding. - A lukewarm (not cold) bath or shower can help lower body temperature. Cold water can cause shivering and  raise temperature. If shivering starts, dry off and cover with lightweight clothing. ~ Analgesic/Antipyretic Advice - Acetaminophen: Consider acetaminophen as directed on label or by pharmacist/provider for pain or fever. PRECAUTIONS: - Use only if there is no history of liver disease, alcoholism, or intake of three or more alcohol drinks per day. - If approved by provider when breastfeeding. - Do not exceed recommended dose or frequency. ~ Analgesic/Antipyretic Advice - NSAIDs: Consider aspirin, ibuprofen, naproxen or ketoprofen for pain or fever as directed on label or by pharmacist/provider. PRECAUTIONS: - If over 80 years of age, should not take longer than 1 week without consulting provider. EXCEPTIONS: - Should not be used if taking blood thinners or have bleeding problems. - Do not use if have history of sensitivity/allergy to any of these medications; or history of cardiovascular, ulcer, kidney, liver disease or diabetes unless approved by provider. - Do not exceed recommended dose or frequency. ~ 12/22/2012 9:51:54PM Page 1 of 2 [id number] [facility] Triage Call Report Patient Name: Zaiya Annunziato continuation page/s - Do not exceed recommended dose or frequency. ~ If alone, have someone check in with you several times a day while you are feeling ill. Antiviral medications - Prescribed medications are available in pills, liquids or inhaled powder that help prevent or lessen symptoms of viral illnesses. Antivirals are usually given to persons at a higher risk for flu-related complications or who are very ill. Most healthy people do not need to be treated with antiviral drugs. - Recommended medications for this season: oseltamivir (Tamiflu) and zanamivir (Relenza). - Work best when started within the first two days of symptoms. - Speak with your provider as soon as possible if exposed to  the flu or if you have new symptoms of the flu. ~ Total water intake includes  drinking water, water in beverages, and water contained in food. Fluids make up about 80% of the body's total hydration need. Individual fluid requirement to maintain hydration vary based on physical activity, environmental factors and illness. Limit fluids that contain sugar, caffeine, or alcohol. Urine will be very light yellow color when you drink enough fluids. ~

## 2012-12-23 NOTE — Telephone Encounter (Signed)
Spoke with mother and the patient went to [facility] this morning and she was given the Tamiflu and told to take the Tamiflu and the Z-pak together. I advised I would make Dr.Tabori aware and she should call the GYN to update them as well.      KP

## 2012-12-28 LAB — OB RESULTS CONSOLE RPR: RPR: NONREACTIVE

## 2012-12-28 LAB — OB RESULTS CONSOLE ABO/RH: RH Type: POSITIVE

## 2012-12-28 LAB — OB RESULTS CONSOLE HEPATITIS B SURFACE ANTIGEN: Hepatitis B Surface Ag: NEGATIVE

## 2013-07-19 LAB — OB RESULTS CONSOLE GBS: GBS: NEGATIVE

## 2013-07-26 ENCOUNTER — Encounter (HOSPITAL_COMMUNITY): Payer: Self-pay | Admitting: *Deleted

## 2013-07-26 ENCOUNTER — Inpatient Hospital Stay (HOSPITAL_COMMUNITY)
Admission: AD | Admit: 2013-07-26 | Discharge: 2013-07-26 | Disposition: A | Discharge status: Home / Self Care | Payer: 59 | Source: Ambulatory Visit | Attending: Obstetrics and Gynecology | Admitting: Obstetrics and Gynecology

## 2013-07-26 DIAGNOSIS — O479 False labor, unspecified: Secondary | ICD-10-CM | POA: Insufficient documentation

## 2013-07-26 HISTORY — DX: Nausea with vomiting, unspecified: R11.2

## 2013-07-26 HISTORY — DX: Other specified postprocedural states: Z98.890

## 2013-07-26 NOTE — MAU Note (Signed)
Pt returned from walking, monitors applied. 

## 2013-07-26 NOTE — MAU Note (Signed)
Pt may ambulate or D/C home.

## 2013-07-26 NOTE — MAU Note (Signed)
Dr. Rana Snare notified of pt.  Pt to ambulate x 1 hr and recheck cervix.

## 2013-07-26 NOTE — MAU Note (Signed)
Plan of care discussed with pt.  Pt plans to walk and be rechecked.

## 2013-07-26 NOTE — MAU Note (Signed)
Pt up to walk.

## 2013-08-03 ENCOUNTER — Telehealth (HOSPITAL_COMMUNITY): Payer: Self-pay | Admitting: *Deleted

## 2013-08-03 ENCOUNTER — Encounter (HOSPITAL_COMMUNITY): Payer: Self-pay | Admitting: *Deleted

## 2013-08-03 NOTE — Telephone Encounter (Signed)
Preadmission screen  

## 2013-08-04 ENCOUNTER — Encounter (HOSPITAL_COMMUNITY): Payer: Self-pay

## 2013-08-04 ENCOUNTER — Inpatient Hospital Stay (HOSPITAL_COMMUNITY): Payer: 59 | Admitting: Anesthesiology

## 2013-08-04 ENCOUNTER — Inpatient Hospital Stay (HOSPITAL_COMMUNITY)
Admission: RE | Admit: 2013-08-04 | Discharge: 2013-08-06 | DRG: 775 | Disposition: A | Discharge status: Home / Self Care | Payer: 59 | Source: Ambulatory Visit | Attending: Obstetrics and Gynecology | Admitting: Obstetrics and Gynecology

## 2013-08-04 ENCOUNTER — Encounter (HOSPITAL_COMMUNITY): Payer: Self-pay | Admitting: Anesthesiology

## 2013-08-04 LAB — CBC
Platelets: 150 10*3/uL (ref 150–400)
RBC: 3.88 MIL/uL (ref 3.87–5.11)
RDW: 12.7 % (ref 11.5–15.5)
WBC: 9.2 10*3/uL (ref 4.0–10.5)

## 2013-08-04 MED ORDER — BENZOCAINE-MENTHOL 20-0.5 % EX AERO
1.0000 "application " | INHALATION_SPRAY | CUTANEOUS | Status: DC | PRN
  Administered 2013-08-04: 1 via TOPICAL
  Filled 2013-08-04: qty 56

## 2013-08-04 MED ORDER — ACETAMINOPHEN 325 MG PO TABS: 650.0000 mg | ORAL_TABLET | ORAL | Status: DC | PRN

## 2013-08-04 MED ORDER — EPHEDRINE 5 MG/ML INJ
10.0000 mg | INTRAVENOUS | Status: DC | PRN
  Filled 2013-08-04: qty 2

## 2013-08-04 MED ORDER — LACTATED RINGERS IV SOLN: 500.0000 mL | INTRAVENOUS | Status: DC | PRN

## 2013-08-04 MED ORDER — TERBUTALINE SULFATE 1 MG/ML IJ SOLN: 0.2500 mg | Freq: Once | INTRAMUSCULAR | Status: DC | PRN

## 2013-08-04 MED ORDER — FLEET ENEMA 7-19 GM/118ML RE ENEM: 1.0000 | ENEMA | Freq: Every day | RECTAL | Status: DC | PRN

## 2013-08-04 MED ORDER — SENNOSIDES-DOCUSATE SODIUM 8.6-50 MG PO TABS
2.0000 | ORAL_TABLET | Freq: Every day | ORAL | Status: DC
  Administered 2013-08-04 – 2013-08-05 (×2): 2 via ORAL

## 2013-08-04 MED ORDER — DIPHENHYDRAMINE HCL 25 MG PO CAPS: 25.0000 mg | ORAL_CAPSULE | Freq: Four times a day (QID) | ORAL | Status: DC | PRN

## 2013-08-04 MED ORDER — ONDANSETRON HCL 4 MG/2ML IJ SOLN: 4.0000 mg | INTRAMUSCULAR | Status: DC | PRN

## 2013-08-04 MED ORDER — TETANUS-DIPHTH-ACELL PERTUSSIS 5-2.5-18.5 LF-MCG/0.5 IM SUSP: 0.5000 mL | Freq: Once | INTRAMUSCULAR | Status: DC

## 2013-08-04 MED ORDER — WITCH HAZEL-GLYCERIN EX PADS
1.0000 "application " | MEDICATED_PAD | CUTANEOUS | Status: DC | PRN
  Administered 2013-08-04: 1 via TOPICAL

## 2013-08-04 MED ORDER — OXYCODONE-ACETAMINOPHEN 5-325 MG PO TABS
1.0000 | ORAL_TABLET | ORAL | Status: DC | PRN
  Administered 2013-08-04 – 2013-08-05 (×4): 1 via ORAL
  Filled 2013-08-04 (×3): qty 1
  Filled 2013-08-04: qty 2

## 2013-08-04 MED ORDER — BISACODYL 10 MG RE SUPP: 10.0000 mg | Freq: Every day | RECTAL | Status: DC | PRN

## 2013-08-04 MED ORDER — LANOLIN HYDROUS EX OINT: TOPICAL_OINTMENT | CUTANEOUS | Status: DC | PRN

## 2013-08-04 MED ORDER — PHENYLEPHRINE 40 MCG/ML (10ML) SYRINGE FOR IV PUSH (FOR BLOOD PRESSURE SUPPORT)
80.0000 ug | PREFILLED_SYRINGE | INTRAVENOUS | Status: DC | PRN
  Filled 2013-08-04: qty 2
  Filled 2013-08-04: qty 5

## 2013-08-04 MED ORDER — PRENATAL MULTIVITAMIN CH
1.0000 | ORAL_TABLET | Freq: Every day | ORAL | Status: DC
  Administered 2013-08-05: 1 via ORAL
  Filled 2013-08-04: qty 1

## 2013-08-04 MED ORDER — SIMETHICONE 80 MG PO CHEW: 80.0000 mg | CHEWABLE_TABLET | ORAL | Status: DC | PRN

## 2013-08-04 MED ORDER — FENTANYL 2.5 MCG/ML BUPIVACAINE 1/10 % EPIDURAL INFUSION (WH - ANES)
INTRAMUSCULAR | Status: DC | PRN
Start: 2013-08-04 — End: 2013-08-04
  Administered 2013-08-04: 12.5 mL/h via EPIDURAL

## 2013-08-04 MED ORDER — IBUPROFEN 600 MG PO TABS
600.0000 mg | ORAL_TABLET | Freq: Four times a day (QID) | ORAL | Status: DC | PRN
  Administered 2013-08-04: 600 mg via ORAL
  Filled 2013-08-04: qty 1

## 2013-08-04 MED ORDER — OXYTOCIN 40 UNITS IN LACTATED RINGERS INFUSION - SIMPLE MED
62.5000 mL/h | INTRAVENOUS | Status: DC
  Filled 2013-08-04: qty 1000

## 2013-08-04 MED ORDER — DIPHENHYDRAMINE HCL 50 MG/ML IJ SOLN: 12.5000 mg | INTRAMUSCULAR | Status: DC | PRN

## 2013-08-04 MED ORDER — ONDANSETRON HCL 4 MG/2ML IJ SOLN: 4.0000 mg | Freq: Four times a day (QID) | INTRAMUSCULAR | Status: DC | PRN

## 2013-08-04 MED ORDER — DIBUCAINE 1 % RE OINT: 1.0000 "application " | TOPICAL_OINTMENT | RECTAL | Status: DC | PRN

## 2013-08-04 MED ORDER — OXYTOCIN BOLUS FROM INFUSION: 500.0000 mL | INTRAVENOUS | Status: DC

## 2013-08-04 MED ORDER — OXYCODONE-ACETAMINOPHEN 5-325 MG PO TABS: 1.0000 | ORAL_TABLET | ORAL | Status: DC | PRN

## 2013-08-04 MED ORDER — LACTATED RINGERS IV SOLN
INTRAVENOUS | Status: DC
  Administered 2013-08-04 (×2): via INTRAVENOUS

## 2013-08-04 MED ORDER — LIDOCAINE HCL (PF) 1 % IJ SOLN
30.0000 mL | INTRAMUSCULAR | Status: AC | PRN
  Administered 2013-08-04: 30 mL via SUBCUTANEOUS
  Filled 2013-08-04 (×2): qty 30

## 2013-08-04 MED ORDER — PHENYLEPHRINE 40 MCG/ML (10ML) SYRINGE FOR IV PUSH (FOR BLOOD PRESSURE SUPPORT)
80.0000 ug | PREFILLED_SYRINGE | INTRAVENOUS | Status: DC | PRN
  Filled 2013-08-04: qty 2

## 2013-08-04 MED ORDER — ONDANSETRON HCL 4 MG PO TABS: 4.0000 mg | ORAL_TABLET | ORAL | Status: DC | PRN

## 2013-08-04 MED ORDER — EPHEDRINE 5 MG/ML INJ
10.0000 mg | INTRAVENOUS | Status: DC | PRN
  Filled 2013-08-04: qty 4
  Filled 2013-08-04: qty 2

## 2013-08-04 MED ORDER — ZOLPIDEM TARTRATE 5 MG PO TABS: 5.0000 mg | ORAL_TABLET | Freq: Every evening | ORAL | Status: DC | PRN

## 2013-08-04 MED ORDER — LIDOCAINE HCL (PF) 1 % IJ SOLN
INTRAMUSCULAR | Status: DC | PRN
  Administered 2013-08-04 (×2): 4 mL

## 2013-08-04 MED ORDER — LACTATED RINGERS IV SOLN: 500.0000 mL | Freq: Once | INTRAVENOUS | Status: DC

## 2013-08-04 MED ORDER — FENTANYL 2.5 MCG/ML BUPIVACAINE 1/10 % EPIDURAL INFUSION (WH - ANES)
14.0000 mL/h | INTRAMUSCULAR | Status: DC | PRN
  Filled 2013-08-04: qty 125

## 2013-08-04 MED ORDER — MEASLES, MUMPS & RUBELLA VAC ~~LOC~~ INJ: 0.5000 mL | INJECTION | Freq: Once | SUBCUTANEOUS | Status: DC

## 2013-08-04 MED ORDER — IBUPROFEN 600 MG PO TABS
600.0000 mg | ORAL_TABLET | Freq: Four times a day (QID) | ORAL | Status: DC
  Administered 2013-08-04 – 2013-08-06 (×7): 600 mg via ORAL
  Filled 2013-08-04 (×7): qty 1

## 2013-08-04 MED ORDER — MEDROXYPROGESTERONE ACETATE 150 MG/ML IM SUSP: 150.0000 mg | INTRAMUSCULAR | Status: DC | PRN

## 2013-08-04 MED ORDER — CITRIC ACID-SODIUM CITRATE 334-500 MG/5ML PO SOLN: 30.0000 mL | ORAL | Status: DC | PRN

## 2013-08-04 MED ORDER — OXYTOCIN 40 UNITS IN LACTATED RINGERS INFUSION - SIMPLE MED
1.0000 m[IU]/min | INTRAVENOUS | Status: DC
  Administered 2013-08-04: 2 m[IU]/min via INTRAVENOUS

## 2013-08-04 NOTE — Anesthesia Procedure Notes (Signed)
Epidural Patient location during procedure: OB Start time: 08/04/2013 9:40 AM  Staffing Anesthesiologist: Jobe Mutch A. Performed by: anesthesiologist   Preanesthetic Checklist Completed: patient identified, site marked, surgical consent, pre-op evaluation, timeout performed, IV checked, risks and benefits discussed and monitors and equipment checked  Epidural Patient position: sitting Prep: site prepped and draped and DuraPrep Patient monitoring: continuous pulse ox and blood pressure Approach: midline Injection technique: LOR air  Needle:  Needle type: Tuohy  Needle gauge: 17 G Needle length: 9 cm and 9 Needle insertion depth: 5 cm cm Catheter type: closed end flexible Catheter size: 19 Gauge Catheter at skin depth: 10 cm Test dose: negative and Other  Assessment Events: blood not aspirated, injection not painful, no injection resistance, negative IV test and no paresthesia  Additional Notes Patient identified. Risks and benefits discussed including failed block, incomplete  Pain control, post dural puncture headache, nerve damage, paralysis, blood pressure Changes, nausea, vomiting, reactions to medications-both toxic and allergic and post Partum back pain. All questions were answered. Patient expressed understanding and wished to proceed. Sterile technique was used throughout procedure. Epidural site was Dressed with sterile barrier dressing. No paresthesias, signs of intravascular injection Or signs of intrathecal spread were encountered.  Patient was more comfortable after the epidural was dosed. Please see RN's note for documentation of vital signs and FHR which are stable.

## 2013-08-04 NOTE — H&P (Signed)
Katie Lindsey is a 27 y.o. female presenting for Induction of labor secondary to advanced dilation and maternal request Maternal Medical History:  Contractions: Frequency: rare.    Fetal activity: Perceived fetal activity is normal.    Prenatal complications: no prenatal complications Prenatal Complications - Diabetes: none.    OB History   Grav Para Term Preterm Abortions TAB SAB Ect Mult Living   1              Past Medical History  Diagnosis Date  . ADD (attention deficit disorder)   . Dysmenorrhea   . PONV (postoperative nausea and vomiting)   . Hx of ovarian cyst    Past Surgical History  Procedure Laterality Date  . Wisdom tooth extraction    . Gastrocnemius recession     Family History: family history includes Cancer in her maternal grandmother and paternal grandfather; Coronary artery disease in an other family member; Diabetes in her maternal grandfather, mother, paternal grandfather, and another family member; Diverticulitis in her father; Hyperlipidemia in her father; Hypertension in her father, maternal grandfather, mother, and paternal grandfather; Rheum arthritis in her paternal grandmother. Social History:  reports that she has never smoked. She has never used smokeless tobacco. She reports that she does not drink alcohol or use illicit drugs.   Prenatal Transfer Tool  Maternal Diabetes: No Genetic Screening: Normal Maternal Ultrasounds/Referrals: Normal Fetal Ultrasounds or other Referrals:  None Maternal Substance Abuse:  No Significant Maternal Medications:  None Significant Maternal Lab Results:  None Other Comments:  None  Review of Systems  All other systems reviewed and are negative.    Dilation: 2.5 Effacement (%): 100 Station: 0 Exam by:: Dov Dill MD Blood pressure 123/85, pulse 83, temperature 98.2 F (36.8 C), temperature source Oral, resp. rate 18, height 5\' 1"  (1.549 m), weight 73.936 kg (163 lb). Maternal Exam:  Uterine Assessment:  Contraction strength is mild.  Contraction frequency is rare.   Abdomen: Patient reports no abdominal tenderness. Introitus: not evaluated.     Physical Exam  Constitutional: She appears well-developed.  Cardiovascular: Normal rate, regular rhythm and normal heart sounds.   Respiratory: Effort normal and breath sounds normal.    Prenatal labs: ABO, Rh: A/Positive/-- (01/27 0000) Antibody: Negative (01/27 0000) Rubella: Immune (01/27 0000) RPR: Nonreactive (01/27 0000)  HBsAg: Negative (01/27 0000)  HIV: Non-reactive (01/27 0000)  GBS: Negative (08/18 0000)   Assessment/Plan: IUP at 39 weeks and 2 days Pitocin augmentation - risks reviewed with patient Epidural per patient request   Katie Lindsey L 08/04/2013, 7:39 AM

## 2013-08-04 NOTE — Anesthesia Preprocedure Evaluation (Signed)
Anesthesia Evaluation  Patient identified by MRN, date of birth, ID band Patient awake    Reviewed: Allergy & Precautions, H&P , Patient's Chart, lab work & pertinent test results  History of Anesthesia Complications (+) PONV  Airway Mallampati: III TM Distance: >3 FB Neck ROM: Full    Dental no notable dental hx. (+) Teeth Intact   Pulmonary neg pulmonary ROS,  breath sounds clear to auscultation  Pulmonary exam normal       Cardiovascular negative cardio ROS  Rhythm:Regular Rate:Normal     Neuro/Psych PSYCHIATRIC DISORDERS Anxiety ADDnegative neurological ROS     GI/Hepatic Neg liver ROS, GERD-  Medicated and Controlled,  Endo/Other  Ovarian Cyst Obesity  Renal/GU negative Renal ROS  negative genitourinary   Musculoskeletal negative musculoskeletal ROS (+)   Abdominal (+) + obese,   Peds  Hematology   Anesthesia Other Findings   Reproductive/Obstetrics (+) Pregnancy                           Anesthesia Physical Anesthesia Plan  ASA: II  Anesthesia Plan: Epidural   Post-op Pain Management:    Induction:   Airway Management Planned: Natural Airway  Additional Equipment:   Intra-op Plan:   Post-operative Plan:   Informed Consent: I have reviewed the patients History and Physical, chart, labs and discussed the procedure including the risks, benefits and alternatives for the proposed anesthesia with the patient or authorized representative who has indicated his/her understanding and acceptance.     Plan Discussed with: Anesthesiologist  Anesthesia Plan Comments:         Anesthesia Quick Evaluation

## 2013-08-05 LAB — CBC
HCT: 29 % — ABNORMAL LOW (ref 36.0–46.0)
MCHC: 34.8 g/dL (ref 30.0–36.0)
Platelets: 133 10*3/uL — ABNORMAL LOW (ref 150–400)
RDW: 12.7 % (ref 11.5–15.5)
WBC: 13.6 10*3/uL — ABNORMAL HIGH (ref 4.0–10.5)

## 2013-08-05 LAB — RUBELLA SCREEN: Rubella: 1.71 Index — ABNORMAL HIGH (ref <0.90)

## 2013-08-05 NOTE — Anesthesia Postprocedure Evaluation (Signed)
Anesthesia Post Note  Patient: Katie Lindsey  Procedure(s) Performed: * No procedures listed *  Anesthesia type: Epidural  Patient location: Mother/Baby  Post pain: Pain level controlled  Post assessment: Post-op Vital signs reviewed  Last Vitals:  Filed Vitals:   08/05/13 0659  BP: 116/64  Pulse: 86  Temp: 36.9 C  Resp: 18    Post vital signs: Reviewed  Level of consciousness: awake  Complications: No apparent anesthesia complications

## 2013-08-05 NOTE — Progress Notes (Signed)
Post Partum Day 1 Subjective: no complaints, up ad lib, voiding and tolerating PO  Objective: Blood pressure 116/64, pulse 86, temperature 98.5 F (36.9 C), temperature source Oral, resp. rate 18, height 5\' 1"  (1.549 m), weight 163 lb (73.936 kg), SpO2 99.00%, unknown if currently breastfeeding.  Physical Exam:  General: alert and cooperative Lochia: appropriate Uterine Fundus: firm Incision: perineum intact DVT Evaluation: No evidence of DVT seen on physical exam. Negative Homan's sign. No cords or calf tenderness. No significant calf/ankle edema.   Recent Labs  08/04/13 0640 08/05/13 0555  HGB 12.0 10.1*  HCT 34.6* 29.0*    Assessment/Plan: Plan for discharge tomorrow   LOS: 1 day   Judiann Celia G 08/05/2013, 8:16 AM

## 2013-08-06 MED ORDER — IBUPROFEN 600 MG PO TABS: 600.0000 mg | ORAL_TABLET | Freq: Four times a day (QID) | ORAL | Status: DC

## 2013-08-06 MED ORDER — OXYCODONE-ACETAMINOPHEN 5-325 MG PO TABS: 1.0000 | ORAL_TABLET | ORAL | Status: DC | PRN

## 2013-08-06 NOTE — Discharge Summary (Signed)
Obstetric Discharge Summary Reason for Admission: induction of labor Prenatal Procedures: ultrasound Intrapartum Procedures: spontaneous vaginal delivery Postpartum Procedures: none Complications-Operative and Postpartum: 2 degree perineal laceration Hemoglobin  Date Value Range Status  08/05/2013 10.1* 12.0 - 15.0 g/dL Final     HCT  Date Value Range Status  08/05/2013 29.0* 36.0 - 46.0 % Final    Physical Exam:  General: alert and cooperative Lochia: appropriate Uterine Fundus: firm Incision: perineum intact DVT Evaluation: No evidence of DVT seen on physical exam. Negative Homan's sign. No cords or calf tenderness. No significant calf/ankle edema.  Discharge Diagnoses: Term Pregnancy-delivered  Discharge Information: Date: 08/06/2013 Activity: pelvic rest Diet: routine Medications: None, Ibuprofen and Percocet Condition: stable Instructions: refer to practice specific booklet Discharge to: home   Newborn Data: Live born female  Birth Weight: 8 lb 2 oz (3685 g) APGAR: 9, 9  Home with mother.  Katie Lindsey G 08/06/2013, 8:34 AM

## 2013-08-06 NOTE — Lactation Note (Signed)
This note was copied from the chart of Katie Lindsey. Lactation Consultation Note  Mother is very motivated and committed to breast feed. Baby cluster fed last night and spent several hours on and off the breast. Mother had developed a ridge on both of her nipples yesterday and today, the ridge( positional stripe) has become inflamed, tender and cracked. Mother c/o pain with latch but is continuing to feed at the breast. Mother has small firm breast tissue and moderate-large size nipples that are pronounced. Baby appears to be latching only to the nipple and pinching resulting in the crack noted. Assisted mother with a deeper latch on the breast, wide mouth and flanged lips. Left side, feeding was more comfortable but right remains very painful to latch. Colostrum is easily hand expressed and swallows heard during the feeding. Baby "chews" and mother has been allowing baby to self latch. Correction with latch, alternate massage, expressing and applying her EBM to nipple is helpful. Instructed on the use of comfort gels to promote healing. To pump if needed to promote milk production and removal. Call [facility] office if needs additional assistance.    Patient Name: Katie Lindsey [id number] Date: 08/06/2013 Reason for consult: Follow-up assessment   Maternal Data Has patient been taught Hand Expression?: Yes  Feeding Feeding Type: Breast Milk Length of feed: 15 min  LATCH Score/Interventions Latch: Grasps breast easily, tongue down, lips flanged, rhythmical sucking.  Audible Swallowing: A few with stimulation  Type of Nipple: Everted at rest and after stimulation  Comfort (Breast/Nipple): Filling, red/small blisters or bruises, mild/mod discomfort  Problem noted: Cracked, bleeding, blisters, bruises  Hold (Positioning): Assistance needed to correctly position infant at breast and maintain latch. Intervention(s): Breastfeeding basics reviewed;Support Pillows;Position options;Skin to  skin  LATCH Score: 7  Lactation Tools Discussed/Used Tools: Comfort gels WIC Program: No   Consult Status Consult Status: Complete Follow-up type: In-patient    Christella Hartigan M 08/06/2013, 11:23 AM

## 2013-10-12 ENCOUNTER — Ambulatory Visit (INDEPENDENT_AMBULATORY_CARE_PROVIDER_SITE_OTHER): Payer: 59

## 2013-10-12 DIAGNOSIS — Z23 Encounter for immunization: Secondary | ICD-10-CM

## 2014-01-19 ENCOUNTER — Ambulatory Visit (INDEPENDENT_AMBULATORY_CARE_PROVIDER_SITE_OTHER): Payer: 59 | Admitting: Nurse Practitioner

## 2014-01-19 ENCOUNTER — Encounter: Payer: Self-pay | Admitting: Nurse Practitioner

## 2014-01-19 VITALS — BP 92/59 | HR 83 | Temp 98.5°F | Wt 142.4 lb

## 2014-01-19 DIAGNOSIS — J069 Acute upper respiratory infection, unspecified: Secondary | ICD-10-CM

## 2014-01-19 NOTE — Progress Notes (Signed)
Pre-visit discussion using our clinic review tool. No additional management support is needed unless otherwise documented below in the visit note.  

## 2014-01-19 NOTE — Patient Instructions (Signed)
You have a cold virus causing your symptoms. The average duration of cold symptoms is 14 days. Start daily sinus rinses (neilmed Sinus Rinse). Use 30 mg to 60 mg pseudoephedrine twice daily. Sip fluids every hour. Rest. If you are not feeling better in 1 week or develop fever or chest pain, call us for re-evaluation. Feel better!  Upper Respiratory Infection, Adult An upper respiratory infection (URI) is also sometimes known as the common cold. The upper respiratory tract includes the nose, sinuses, throat, trachea, and bronchi. Bronchi are the airways leading to the lungs. Most people improve within 1 week, but symptoms can last up to 2 weeks. A residual cough may last even longer.  CAUSES Many different viruses can infect the tissues lining the upper respiratory tract. The tissues become irritated and inflamed and often become very moist. Mucus production is also common. A cold is contagious. You can easily spread the virus to others by oral contact. This includes kissing, sharing a glass, coughing, or sneezing. Touching your mouth or nose and then touching a surface, which is then touched by another person, can also spread the virus. SYMPTOMS  Symptoms typically develop 1 to 3 days after you come in contact with a cold virus. Symptoms vary from person to person. They may include:  Runny nose.  Sneezing.  Nasal congestion.  Sinus irritation.  Sore throat.  Loss of voice (laryngitis).  Cough.  Fatigue.  Muscle aches.  Loss of appetite.  Headache.  Low-grade fever. DIAGNOSIS  You might diagnose your own cold based on familiar symptoms, since most people get a cold 2 to 3 times a year. Your caregiver can confirm this based on your exam. Most importantly, your caregiver can check that your symptoms are not due to another disease such as strep throat, sinusitis, pneumonia, asthma, or epiglottitis. Blood tests, throat tests, and X-rays are not necessary to diagnose a common cold, but  they may sometimes be helpful in excluding other more serious diseases. Your caregiver will decide if any further tests are required. RISKS AND COMPLICATIONS  You may be at risk for a more severe case of the common cold if you smoke cigarettes, have chronic heart disease (such as heart failure) or lung disease (such as asthma), or if you have a weakened immune system. The very young and very old are also at risk for more serious infections. Bacterial sinusitis, middle ear infections, and bacterial pneumonia can complicate the common cold. The common cold can worsen asthma and chronic obstructive pulmonary disease (COPD). Sometimes, these complications can require emergency medical care and may be life-threatening. PREVENTION  The best way to protect against getting a cold is to practice good hygiene. Avoid oral or hand contact with people with cold symptoms. Wash your hands often if contact occurs. There is no clear evidence that vitamin C, vitamin E, echinacea, or exercise reduces the chance of developing a cold. However, it is always recommended to get plenty of rest and practice good nutrition. TREATMENT  Treatment is directed at relieving symptoms. There is no cure. Antibiotics are not effective, because the infection is caused by a virus, not by bacteria. Treatment may include:  Increased fluid intake. Sports drinks offer valuable electrolytes, sugars, and fluids.  Breathing heated mist or steam (vaporizer or shower).  Eating chicken soup or other clear broths, and maintaining good nutrition.  Getting plenty of rest.  Using gargles or lozenges for comfort.  Controlling fevers with ibuprofen or acetaminophen as directed by your caregiver.  Increasing usage of your inhaler if you have asthma. Zinc gel and zinc lozenges, taken in the first 24 hours of the common cold, can shorten the duration and lessen the severity of symptoms. Pain medicines may help with fever, muscle aches, and throat  pain. A variety of non-prescription medicines are available to treat congestion and runny nose. Your caregiver can make recommendations and may suggest nasal or lung inhalers for other symptoms.  HOME CARE INSTRUCTIONS   Only take over-the-counter or prescription medicines for pain, discomfort, or fever as directed by your caregiver.  Use a warm mist humidifier or inhale steam from a shower to increase air moisture. This may keep secretions moist and make it easier to breathe.  Drink enough water and fluids to keep your urine clear or pale yellow.  Rest as needed.  Return to work when your temperature has returned to normal or as your caregiver advises. You may need to stay home longer to avoid infecting others. You can also use a face mask and careful hand washing to prevent spread of the virus. SEEK MEDICAL CARE IF:   After the first few days, you feel you are getting worse rather than better.  You need your caregiver's advice about medicines to control symptoms.  You develop chills, worsening shortness of breath, or brown or red sputum. These may be signs of pneumonia.  You develop yellow or brown nasal discharge or pain in the face, especially when you bend forward. These may be signs of sinusitis.  You develop a fever, swollen neck glands, pain with swallowing, or white areas in the back of your throat. These may be signs of strep throat. SEEK IMMEDIATE MEDICAL CARE IF:   You have a fever.  You develop severe or persistent headache, ear pain, sinus pain, or chest pain.  You develop wheezing, a prolonged cough, cough up blood, or have a change in your usual mucus (if you have chronic lung disease).  You develop sore muscles or a stiff neck. Document Released: 05/14/2001 Document Revised: 02/10/2012 Document Reviewed: 03/22/2011 [facility] Patient Information 2014 [place], [place].

## 2014-01-19 NOTE — Progress Notes (Signed)
   Subjective:    Patient ID: Katie Lindsey, female    DOB: 02-Nov-1986, 28 y.o.   MRN: [id number]  Sinusitis This is a new problem. The current episode started 1 to 4 weeks ago (ear pain 1 wk, nasal congestion 2 days.). The problem is unchanged. There has been no fever. The pain is mild. Associated symptoms include congestion, ear pain, headaches and a sore throat (scratchy). Pertinent negatives include no chills, coughing, hoarse voice, shortness of breath, sinus pressure, sneezing or swollen glands. Treatments tried: zyrtec. The treatment provided no relief.      Review of Systems  Constitutional: Positive for fatigue (has 5 mo. baby-says always tired. no increase in fatigue.). Negative for fever, chills, activity change and appetite change.  HENT: Positive for congestion, ear pain and sore throat (scratchy). Negative for hoarse voice, sinus pressure and sneezing.   Respiratory: Negative for cough, chest tightness, shortness of breath and wheezing.   Cardiovascular: Negative for chest pain.  Gastrointestinal: Negative for abdominal pain.  Musculoskeletal: Negative for arthralgias and back pain.  Neurological: Positive for headaches.  Hematological: Negative for adenopathy.       Objective:   Physical Exam  Vitals reviewed. Constitutional: She is oriented to person, place, and time. She appears well-developed and well-nourished. No distress.  HENT:  Head: Normocephalic and atraumatic.  Right Ear: External ear normal.  Left Ear: External ear normal.  Mouth/Throat: Oropharynx is clear and moist. No oropharyngeal exudate.  Eyes: Conjunctivae are normal. Right eye exhibits no discharge. Left eye exhibits no discharge.  Neck: Normal range of motion. Neck supple. No thyromegaly present.  Cardiovascular: Normal rate, regular rhythm and normal heart sounds.   No murmur heard. Pulmonary/Chest: Effort normal and breath sounds normal. No respiratory distress. She has no wheezes. She has  no rales.  Lymphadenopathy:    She has no cervical adenopathy.  Neurological: She is alert and oriented to person, place, and time.  Skin: Skin is warm and dry.  Psychiatric: She has a normal mood and affect. Her behavior is normal. Thought content normal.          Assessment & Plan:   1. Viral upper respiratory illness Afebrile, nasal congestion 2d, nml exam. See pt instructions.

## 2014-10-03 ENCOUNTER — Encounter: Payer: Self-pay | Admitting: Nurse Practitioner

## 2014-11-11 ENCOUNTER — Other Ambulatory Visit: Payer: Self-pay | Admitting: Obstetrics and Gynecology

## 2014-11-14 LAB — CYTOLOGY - PAP

## 2015-02-08 ENCOUNTER — Encounter: Payer: Self-pay | Admitting: Family Medicine

## 2015-02-08 ENCOUNTER — Ambulatory Visit (INDEPENDENT_AMBULATORY_CARE_PROVIDER_SITE_OTHER): Payer: 59 | Admitting: Family Medicine

## 2015-02-08 VITALS — BP 120/80 | HR 101 | Temp 98.0°F | Resp 16 | Wt 132.0 lb

## 2015-02-08 DIAGNOSIS — J0100 Acute maxillary sinusitis, unspecified: Secondary | ICD-10-CM

## 2015-02-08 MED ORDER — AMOXICILLIN 875 MG PO TABS: 875.0000 mg | ORAL_TABLET | Freq: Two times a day (BID) | ORAL | Status: DC

## 2015-02-08 NOTE — Patient Instructions (Signed)
Follow up as needed Start the Amoxicillin twice daily- take w/ food Drink plenty of fluids Continue the Zyrtec daily REST! Call with any questions or concerns Hang in there!

## 2015-02-08 NOTE — Progress Notes (Signed)
Pre visit review using our clinic review tool, if applicable. No additional management support is needed unless otherwise documented below in the visit note. 

## 2015-02-08 NOTE — Assessment & Plan Note (Signed)
Pt's sxs and PE consistent w/ infxn.  Reviewed UC notes.  At this time, needs abx.  Reviewed supportive care and red flags that should prompt return.  Pt expressed understanding and is in agreement w/ plan.

## 2015-02-08 NOTE — Progress Notes (Signed)
   Subjective:    Patient ID: Katie Lindsey, female    DOB: [birth year], 29 y.o.   MRN: [id number]  HPI URI- pt was seen at [facility] on Sunday and was told she had a viral illness.  Called back on Monday b/c she was dizzy and had a fever.  + nasal congestion, sinus pain/pressure, laryngitis, PND.  Pt taking Zyrtec D, Flonase, Ibuprofen 800mg  w/o relief.  Bilateral ear pain, R>L.  + HA.  No nausea.  + tooth pain.   Review of Systems For ROS see HPI     Objective:   Physical Exam  Constitutional: She appears well-developed and well-nourished. No distress.  HENT:  Head: Normocephalic and atraumatic.  Right Ear: Tympanic membrane normal.  Left Ear: Tympanic membrane normal.  Nose: Mucosal edema and rhinorrhea present. Right sinus exhibits maxillary sinus tenderness. Right sinus exhibits no frontal sinus tenderness. Left sinus exhibits maxillary sinus tenderness. Left sinus exhibits no frontal sinus tenderness.  Mouth/Throat: Uvula is midline and mucous membranes are normal. Posterior oropharyngeal erythema present. No oropharyngeal exudate.  Eyes: Conjunctivae and EOM are normal. Pupils are equal, round, and reactive to light.  Neck: Normal range of motion. Neck supple.  Cardiovascular: Normal rate, regular rhythm and normal heart sounds.   Pulmonary/Chest: Effort normal and breath sounds normal. No respiratory distress. She has no wheezes.  Lymphadenopathy:    She has no cervical adenopathy.  Vitals reviewed.         Assessment & Plan:

## 2015-05-16 ENCOUNTER — Encounter: Payer: Self-pay | Admitting: Physician Assistant

## 2015-05-16 ENCOUNTER — Ambulatory Visit (INDEPENDENT_AMBULATORY_CARE_PROVIDER_SITE_OTHER): Payer: 59 | Admitting: Physician Assistant

## 2015-05-16 VITALS — BP 102/66 | HR 90 | Temp 98.4°F | Resp 16 | Ht 61.0 in | Wt 128.5 lb

## 2015-05-16 DIAGNOSIS — J019 Acute sinusitis, unspecified: Secondary | ICD-10-CM

## 2015-05-16 DIAGNOSIS — B9689 Other specified bacterial agents as the cause of diseases classified elsewhere: Secondary | ICD-10-CM | POA: Insufficient documentation

## 2015-05-16 MED ORDER — DOXYCYCLINE HYCLATE 100 MG PO CAPS: 100.0000 mg | ORAL_CAPSULE | Freq: Two times a day (BID) | ORAL | Status: AC

## 2015-05-16 NOTE — Progress Notes (Signed)
Pre visit review using our clinic review tool, if applicable. No additional management support is needed unless otherwise documented below in the visit note/SLS  

## 2015-05-16 NOTE — Assessment & Plan Note (Signed)
Rx Doxycycline.  Increase fluids.  Rest.  Saline nasal spray.  Probiotic.  Mucinex as directed.  Humidifier in bedroom. Continue allergy medications.  Call or return to clinic if symptoms are not improving.

## 2015-05-16 NOTE — Patient Instructions (Signed)
Please take antibiotic as directed.  Increase fluid intake.  Use Saline nasal spray.  Take a daily multivitamin. Continue your Zyrtec.  Use Mucinex-D for congestion.  Place a humidifier in the bedroom.  Please call or return clinic if symptoms are not improving.  Sinusitis Sinusitis is redness, soreness, and swelling (inflammation) of the paranasal sinuses. Paranasal sinuses are air pockets within the bones of your face (beneath the eyes, the middle of the forehead, or above the eyes). In healthy paranasal sinuses, mucus is able to drain out, and air is able to circulate through them by way of your nose. However, when your paranasal sinuses are inflamed, mucus and air can become trapped. This can allow bacteria and other germs to grow and cause infection. Sinusitis can develop quickly and last only a short time (acute) or continue over a long period (chronic). Sinusitis that lasts for more than 12 weeks is considered chronic.  CAUSES  Causes of sinusitis include:  Allergies.  Structural abnormalities, such as displacement of the cartilage that separates your nostrils (deviated septum), which can decrease the air flow through your nose and sinuses and affect sinus drainage.  Functional abnormalities, such as when the small hairs (cilia) that line your sinuses and help remove mucus do not work properly or are not present. SYMPTOMS  Symptoms of acute and chronic sinusitis are the same. The primary symptoms are pain and pressure around the affected sinuses. Other symptoms include:  Upper toothache.  Earache.  Headache.  Bad breath.  Decreased sense of smell and taste.  A cough, which worsens when you are lying flat.  Fatigue.  Fever.  Thick drainage from your nose, which often is green and may contain pus (purulent).  Swelling and warmth over the affected sinuses. DIAGNOSIS  Your caregiver will perform a physical exam. During the exam, your caregiver may:  Look in your nose for  signs of abnormal growths in your nostrils (nasal polyps).  Tap over the affected sinus to check for signs of infection.  View the inside of your sinuses (endoscopy) with a special imaging device with a light attached (endoscope), which is inserted into your sinuses. If your caregiver suspects that you have chronic sinusitis, one or more of the following tests may be recommended:  Allergy tests.  Nasal culture A sample of mucus is taken from your nose and sent to a lab and screened for bacteria.  Nasal cytology A sample of mucus is taken from your nose and examined by your caregiver to determine if your sinusitis is related to an allergy. TREATMENT  Most cases of acute sinusitis are related to a viral infection and will resolve on their own within 10 days. Sometimes medicines are prescribed to help relieve symptoms (pain medicine, decongestants, nasal steroid sprays, or saline sprays).  However, for sinusitis related to a bacterial infection, your caregiver will prescribe antibiotic medicines. These are medicines that will help kill the bacteria causing the infection.  Rarely, sinusitis is caused by a fungal infection. In theses cases, your caregiver will prescribe antifungal medicine. For some cases of chronic sinusitis, surgery is needed. Generally, these are cases in which sinusitis recurs more than 3 times per year, despite other treatments. HOME CARE INSTRUCTIONS   Drink plenty of water. Water helps thin the mucus so your sinuses can drain more easily.  Use a humidifier.  Inhale steam 3 to 4 times a day (for example, sit in the bathroom with the shower running).  Apply a warm, moist washcloth to  your face 3 to 4 times a day, or as directed by your caregiver.  Use saline nasal sprays to help moisten and clean your sinuses.  Take over-the-counter or prescription medicines for pain, discomfort, or fever only as directed by your caregiver. SEEK IMMEDIATE MEDICAL CARE IF:  You have  increasing pain or severe headaches.  You have nausea, vomiting, or drowsiness.  You have swelling around your face.  You have vision problems.  You have a stiff neck.  You have difficulty breathing. MAKE SURE YOU:   Understand these instructions.  Will watch your condition.  Will get help right away if you are not doing well or get worse. Document Released: 11/18/2005 Document Revised: 02/10/2012 Document Reviewed: 12/03/2011 [facility] Patient Information 2014 [place], [place].

## 2015-05-16 NOTE — Progress Notes (Signed)
Patient presents to clinic today c/o 1 week of worsening sinus pressure, sinus pain, drainage, cough that is now productive of yellow phlegm. Denies fever, chills.  Endorses nausea when PND is severe.  Denies sick contact.  Past Medical History  Diagnosis Date  . ADD (attention deficit disorder)   . Dysmenorrhea   . PONV (postoperative nausea and vomiting)   . Hx of ovarian cyst     Current Outpatient Prescriptions on File Prior to Visit  Medication Sig Dispense Refill  . FALMINA 0.1-20 MG-MCG tablet Take by mouth daily.     . fluticasone (FLONASE) 50 MCG/ACT nasal spray      No current facility-administered medications on file prior to visit.    Allergies  Allergen Reactions  . Erythromycin   . Hydrocodone-Acetaminophen Nausea And Vomiting  . Monosodium Glutamate     REACTION: rash, itching    Family History  Problem Relation Age of Onset  . Diabetes Mother   . Hypertension Mother   . Diabetes      grandfather  . Coronary artery disease      grandfather  . Hypertension Father   . Hyperlipidemia Father   . Diverticulitis Father   . Cancer Maternal Grandmother     breast  . Hypertension Maternal Grandfather   . Diabetes Maternal Grandfather   . Rheum arthritis Paternal Grandmother   . Hypertension Paternal Grandfather   . Diabetes Paternal Grandfather   . Cancer Paternal Grandfather     lung    History   Social History  . Marital Status: Married    Spouse Name: N/A  . Number of Children: N/A  . Years of Education: N/A   Social History Main Topics  . Smoking status: Never Smoker   . Smokeless tobacco: Never Used  . Alcohol Use: No  . Drug Use: No  . Sexual Activity: Yes    Birth Control/ Protection: None   Other Topics Concern  . None   Social History Narrative   Lives with mom and dad.   **Has phobia of needles**         Review of Systems - See HPI.  All other ROS are negative.  BP 102/66 mmHg  Pulse 90  Temp(Src) 98.4 F (36.9 C)  (Oral)  Resp 16  Ht 5\' 1"  (1.549 m)  Wt 128 lb 8 oz (58.287 kg)  BMI 24.29 kg/m2  SpO2 100%  LMP 05/02/2015  Physical Exam  Constitutional: She is oriented to person, place, and time and well-developed, well-nourished, and in no distress.  HENT:  Head: Normocephalic and atraumatic.  Right Ear: Tympanic membrane, external ear and ear canal normal.  Left Ear: Tympanic membrane, external ear and ear canal normal.  Nose: Right sinus exhibits frontal sinus tenderness. Left sinus exhibits frontal sinus tenderness.  Mouth/Throat: Uvula is midline, oropharynx is clear and moist and mucous membranes are normal. No oropharyngeal exudate.  Eyes: Conjunctivae are normal. Pupils are equal, round, and reactive to light.  Neck: Neck supple.  Cardiovascular: Normal rate, regular rhythm, normal heart sounds and intact distal pulses.   Pulmonary/Chest: Effort normal and breath sounds normal. No respiratory distress. She has no wheezes. She has no rales. She exhibits no tenderness.  Lymphadenopathy:    She has no cervical adenopathy.  Neurological: She is alert and oriented to person, place, and time.  Skin: Skin is warm and dry. No rash noted.  Psychiatric: Affect normal.  Vitals reviewed.    Assessment/Plan: Acute bacterial sinusitis Rx Doxycycline.  Increase fluids.  Rest.  Saline nasal spray.  Probiotic.  Mucinex as directed.  Humidifier in bedroom. Continue allergy medications.  Call or return to clinic if symptoms are not improving.

## 2017-04-02 ENCOUNTER — Other Ambulatory Visit: Payer: Self-pay | Admitting: Obstetrics and Gynecology

## 2017-04-02 DIAGNOSIS — N644 Mastodynia: Secondary | ICD-10-CM

## 2017-04-15 ENCOUNTER — Ambulatory Visit
Admission: RE | Admit: 2017-04-15 | Discharge: 2017-04-15 | Disposition: A | Discharge status: Home / Self Care | Payer: 59 | Source: Ambulatory Visit | Attending: Obstetrics and Gynecology | Admitting: Obstetrics and Gynecology

## 2017-04-15 DIAGNOSIS — N644 Mastodynia: Secondary | ICD-10-CM

## 2017-07-15 ENCOUNTER — Telehealth: Payer: Self-pay | Admitting: Genetic Counselor

## 2017-07-15 NOTE — Telephone Encounter (Signed)
Called and LM on VM.  Asked that she CB and ask to speak with the [facility] on call

## 2017-07-30 ENCOUNTER — Other Ambulatory Visit: Payer: 59

## 2017-07-30 ENCOUNTER — Encounter: Payer: 59 | Admitting: Genetic Counselor
